# Patient Record
Sex: Female | Born: 1955 | Race: Black or African American | Hispanic: No | Smoking: Former smoker
Health system: Southern US, Community
[De-identification: ages and names within clinical notes are randomized; demographics above are authoritative.]

## PROBLEM LIST (undated history)

## (undated) DIAGNOSIS — H269 Unspecified cataract: Secondary | ICD-10-CM

## (undated) DIAGNOSIS — T7840XA Allergy, unspecified, initial encounter: Secondary | ICD-10-CM

## (undated) HISTORY — PX: CHOLECYSTECTOMY: SHX55

## (undated) HISTORY — PX: CATARACT EXTRACTION, BILATERAL: SHX1313

## (undated) HISTORY — DX: Unspecified cataract: H26.9

## (undated) HISTORY — PX: COLONOSCOPY: SHX174

## (undated) HISTORY — DX: Allergy, unspecified, initial encounter: T78.40XA

---

## 2014-09-24 ENCOUNTER — Other Ambulatory Visit (HOSPITAL_COMMUNITY)
Admission: RE | Admit: 2014-09-24 | Discharge: 2014-09-24 | Disposition: A | Discharge status: Home / Self Care | Payer: 59 | Source: Ambulatory Visit | Attending: Family Medicine | Admitting: Family Medicine

## 2014-09-24 DIAGNOSIS — Z01419 Encounter for gynecological examination (general) (routine) without abnormal findings: Secondary | ICD-10-CM | POA: Diagnosis not present

## 2015-01-12 ENCOUNTER — Other Ambulatory Visit: Payer: Self-pay

## 2015-01-12 DIAGNOSIS — Z1231 Encounter for screening mammogram for malignant neoplasm of breast: Secondary | ICD-10-CM

## 2015-01-17 ENCOUNTER — Emergency Department (HOSPITAL_COMMUNITY)
Admission: EM | Admit: 2015-01-17 | Discharge: 2015-01-17 | Disposition: A | Discharge status: Home / Self Care | Payer: 59 | Attending: Emergency Medicine | Admitting: Emergency Medicine

## 2015-01-17 ENCOUNTER — Emergency Department (HOSPITAL_COMMUNITY): Payer: 59

## 2015-01-17 ENCOUNTER — Encounter (HOSPITAL_COMMUNITY): Payer: Self-pay | Admitting: Cardiology

## 2015-01-17 DIAGNOSIS — S6992XA Unspecified injury of left wrist, hand and finger(s), initial encounter: Secondary | ICD-10-CM | POA: Diagnosis present

## 2015-01-17 DIAGNOSIS — Y998 Other external cause status: Secondary | ICD-10-CM | POA: Insufficient documentation

## 2015-01-17 DIAGNOSIS — S62665A Nondisplaced fracture of distal phalanx of left ring finger, initial encounter for closed fracture: Secondary | ICD-10-CM | POA: Diagnosis not present

## 2015-01-17 DIAGNOSIS — Z87891 Personal history of nicotine dependence: Secondary | ICD-10-CM | POA: Diagnosis not present

## 2015-01-17 DIAGNOSIS — Y9389 Activity, other specified: Secondary | ICD-10-CM | POA: Diagnosis not present

## 2015-01-17 DIAGNOSIS — S62609A Fracture of unspecified phalanx of unspecified finger, initial encounter for closed fracture: Secondary | ICD-10-CM

## 2015-01-17 DIAGNOSIS — W07XXXA Fall from chair, initial encounter: Secondary | ICD-10-CM | POA: Diagnosis not present

## 2015-01-17 DIAGNOSIS — Y9289 Other specified places as the place of occurrence of the external cause: Secondary | ICD-10-CM | POA: Diagnosis not present

## 2015-01-17 MED ORDER — HYDROCODONE-ACETAMINOPHEN 5-325 MG PO TABS: 1.0000 | ORAL_TABLET | Freq: Four times a day (QID) | ORAL | Status: DC | PRN

## 2015-01-17 NOTE — ED Notes (Signed)
Pt reports she fell out of chair Saturday and c/o left ring finger pain.

## 2015-01-17 NOTE — Discharge Instructions (Signed)
Do no take the narcotic while driving. Continue to take the Advil. Follow up with Dr. Ophelia Charter.

## 2015-01-17 NOTE — ED Provider Notes (Signed)
CSN: [id number]     Arrival date & time 01/17/15  1803 History  By signing my name below, I, Murriel Hopper, attest that this documentation has been prepared under the direction and in the presence of Kerrie Buffalo, NP Electronically Signed: Murriel Hopper, ED Scribe. 01/17/2015. 6:58 PM.     Chief Complaint  Patient presents with  . Hand Pain      The history is provided by the patient. No language interpreter was used.   HPI Comments: Tara Nunez is a 59 y.o. female who presents to the Emergency Department complaining of constant left hand pain to her left ring finger with associated swelling that has been present for two days. Pt states she caught her ring finger in a chair when she tried to pull it towards her to sit in it, and fell on the floor. Pt denies hitting her head or any other symptoms. Pt patient has been taking Advil for pain.   History reviewed. No pertinent past medical history. Past Surgical History  Procedure Laterality Date  . Cholecystectomy     History reviewed. No pertinent family history. Social History  Substance Use Topics  . Smoking status: Former Games developer  . Smokeless tobacco: None  . Alcohol Use: No   OB History    No data available     Review of Systems Negative except as stated in HPI  Allergies  Review of patient's allergies indicates no known allergies.  Home Medications   Prior to Admission medications   Medication Sig Start Date End Date Taking? Authorizing Provider  HYDROcodone-acetaminophen (NORCO) 5-325 MG per tablet Take 1 tablet by mouth every 6 (six) hours as needed. 01/17/15   Hope Orlene Och, NP   BP 126/81 mmHg  Pulse 79  Temp(Src) 97.4 F (36.3 C) (Oral)  Resp 20  SpO2 100% Physical Exam  Constitutional: She is oriented to person, place, and time. She appears well-developed and well-nourished.  Non-toxic appearance. No distress.  HENT:  Head: Normocephalic and atraumatic.  Eyes: Conjunctivae, EOM and lids are normal.  Neck:  Normal range of motion. Neck supple. No thyroid mass present.  Cardiovascular: Normal rate.  Exam reveals no gallop.   No murmur heard. Pulmonary/Chest: Effort normal. She has no decreased breath sounds. She has no rhonchi.  Abdominal: Normal appearance. There is no CVA tenderness.  Musculoskeletal:       Left hand: She exhibits tenderness, bony tenderness and swelling. She exhibits normal capillary refill, no deformity and no laceration. Decreased range of motion: due to pain. Normal sensation noted. Normal strength noted.       Hands: 2+ radial pulse Adequate circulation Pain, swelling, ecchymosis of left ring finger at PIP  Neurological: She is alert and oriented to person, place, and time. She has normal strength. No cranial nerve deficit or sensory deficit. GCS eye subscore is 4. GCS verbal subscore is 5. GCS motor subscore is 6.  Skin: Skin is warm and dry. No abrasion noted.  Skin intact  Psychiatric: She has a normal mood and affect. Her speech is normal and behavior is normal.  Nursing note and vitals reviewed.   ED Course  Procedures (including critical care time) DIAGNOSTIC STUDIES: Oxygen Saturation is 100% on room air, normal by my interpretation.    COORDINATION OF CARE: 6:58 PM Discussed treatment plan with pt at bedside and pt agreed to plan.   Labs Review Labs Reviewed - No data to display  Imaging Review Dg Hand Complete Left  01/17/2015  CLINICAL DATA:  Pain in the DIP joint of the ring finger after a fall with hyperextension injury.  EXAM: LEFT HAND - COMPLETE 3+ VIEW  COMPARISON:  None.  FINDINGS: There is a nondisplaced fracture through the base of the distal phalangeal bone of the ring finger best seen on the lateral view. The fracture does not extend to the articular surface.  Slight arthritic changes of the DIP joint of the long finger and of the PIP joint of the ring finger. Congenital fusion of the lunate and triquetrum.  IMPRESSION: Nondisplaced fracture  of the distal phalanx of the ring finger.   Electronically Signed   By: Francene Boyers M.D.   On: 01/17/2015 19:38   I have personally reviewed and evaluated these images as part of my medical decision-making.   MDM  58 y.o. female with left ring finger pain, swelling and ecchymosis s/p injury 2 days ago. Stable for d/c without focal neuro deficits. Splint applied, ice, pain management and follow up with ortho.  Discussed with the patient clinical and x-ray findings and plan of care. All questioned fully answered. .  Final diagnoses:  Fracture, finger, closed, initial encounter   I personally performed the services described in this documentation, which was scribed in my presence. The recorded information has been reviewed and is accurate.   [place], [place] 01/17/15 2117  Benjiman Core, MD 01/18/15 [phone]

## 2015-01-17 NOTE — ED Notes (Signed)
Patient left at this time with all belongings. 

## 2015-02-15 ENCOUNTER — Ambulatory Visit
Admission: RE | Admit: 2015-02-15 | Discharge: 2015-02-15 | Disposition: A | Discharge status: Home / Self Care | Payer: 59 | Source: Ambulatory Visit

## 2015-02-15 DIAGNOSIS — Z1231 Encounter for screening mammogram for malignant neoplasm of breast: Secondary | ICD-10-CM

## 2017-09-26 IMAGING — DX DG HAND COMPLETE 3+V*L*
3 series · 3 of 3 positions shown · non-contrast
Comparison: None.

CLINICAL DATA: Pain in the DIP joint of the ring finger after a
fall with hyperextension injury.

EXAM:
LEFT HAND - COMPLETE 3+ VIEW

[hand pa]
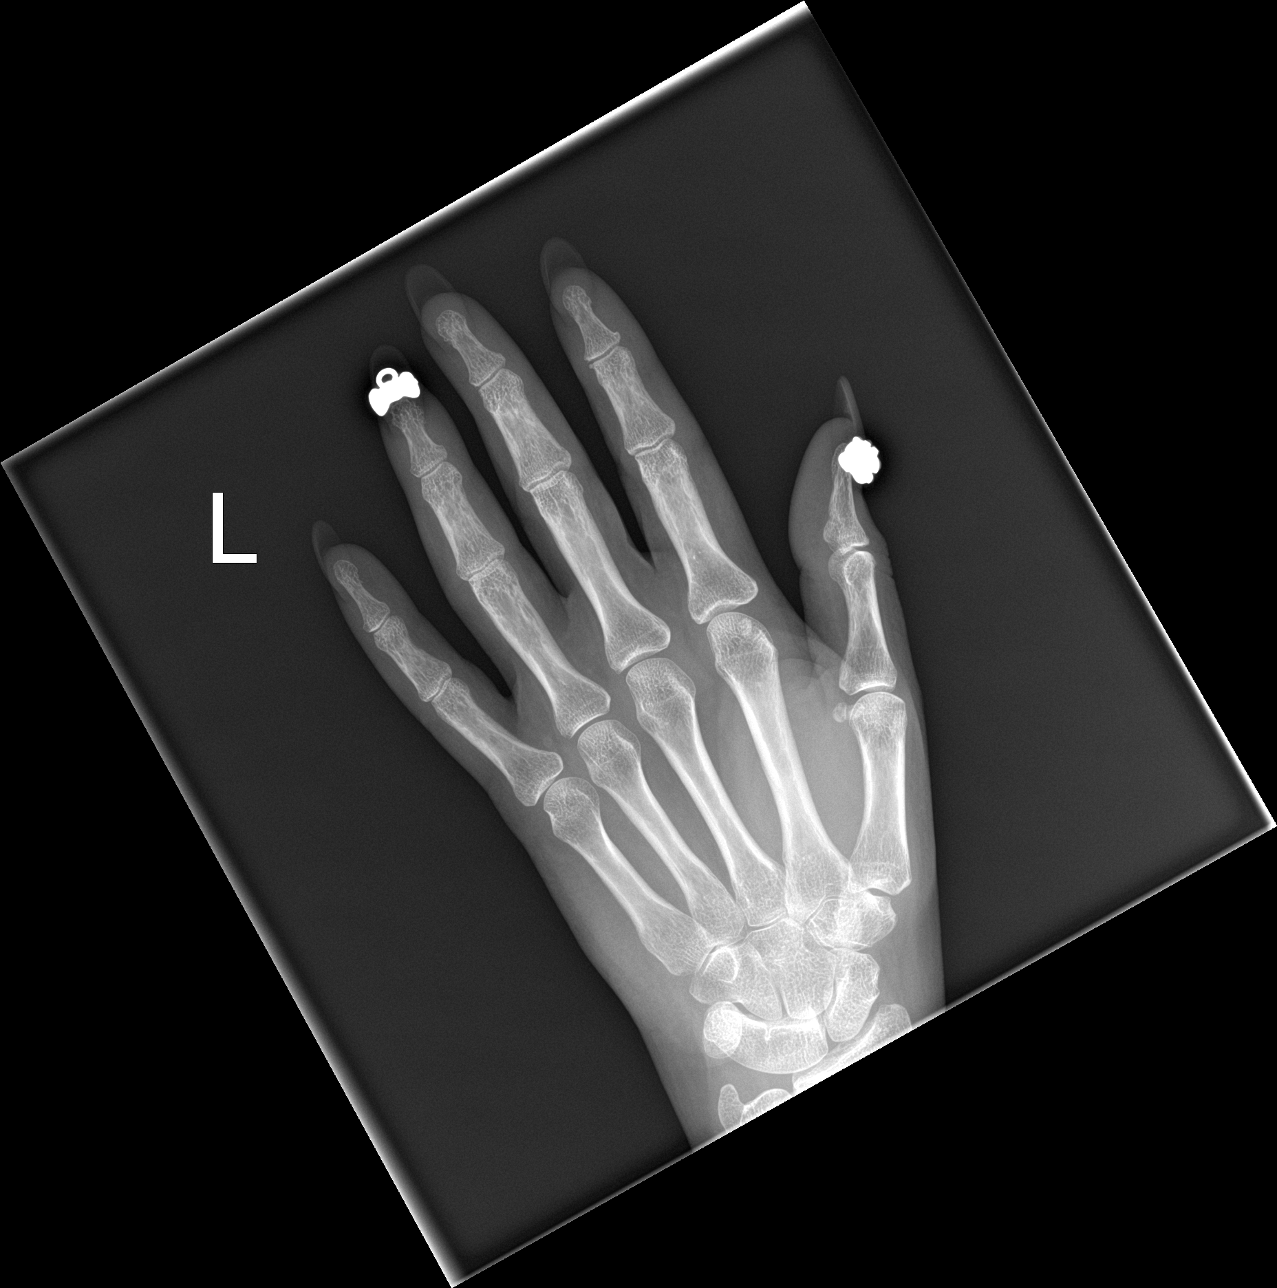

[hand obl]
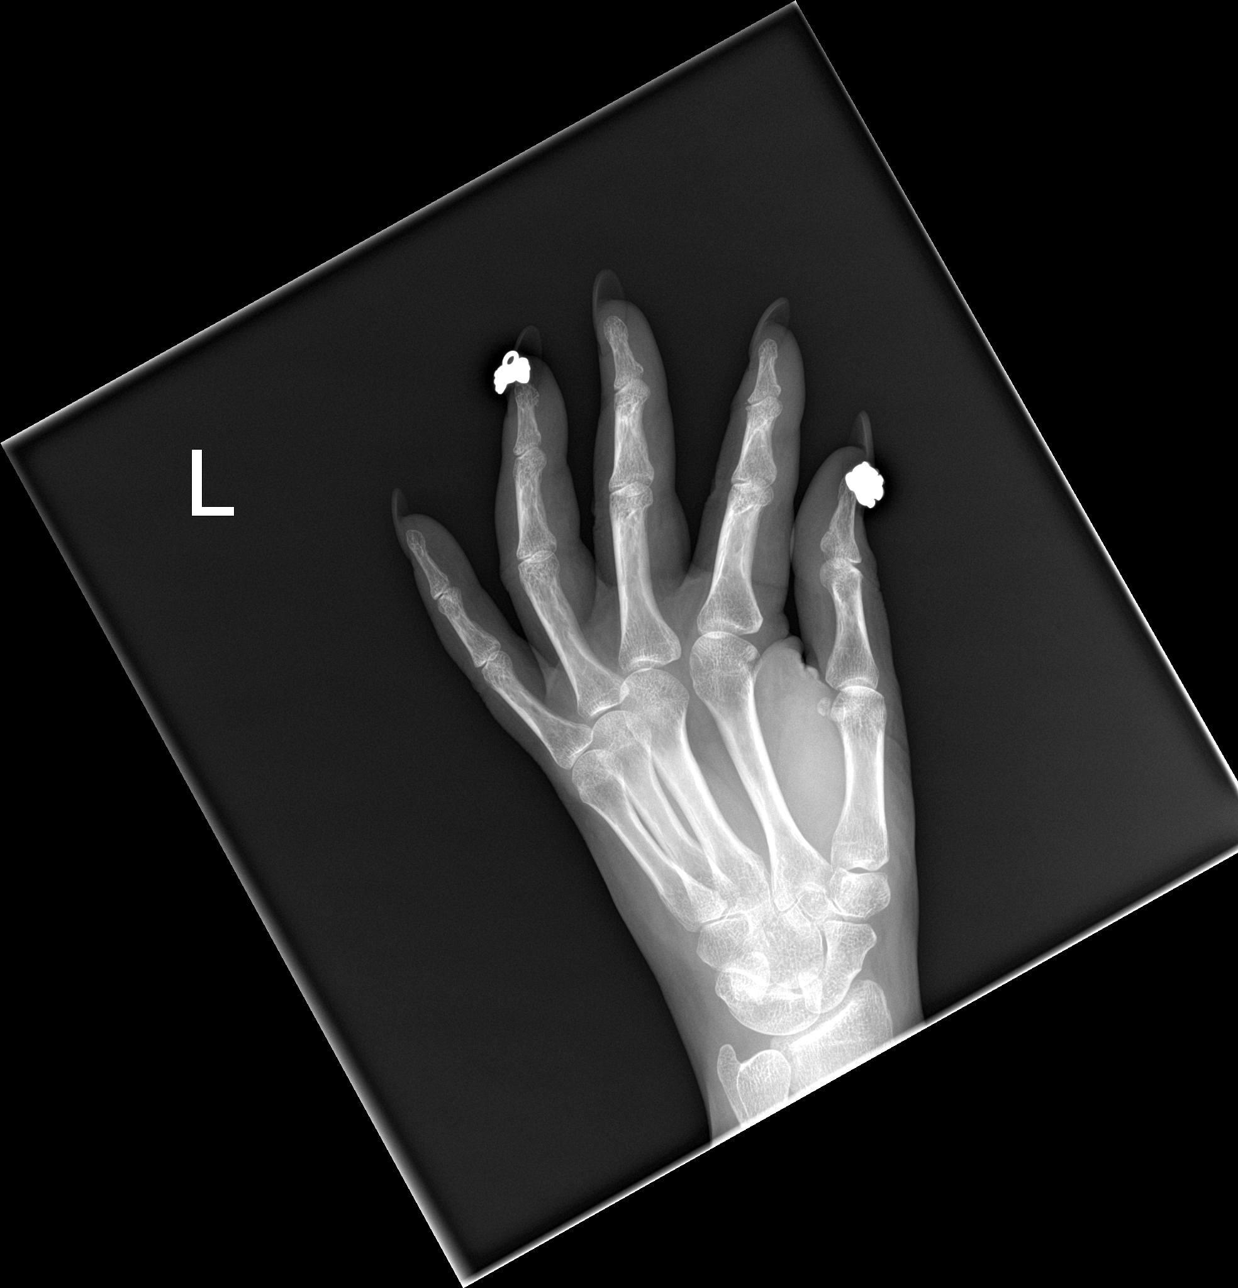

[hand lat]
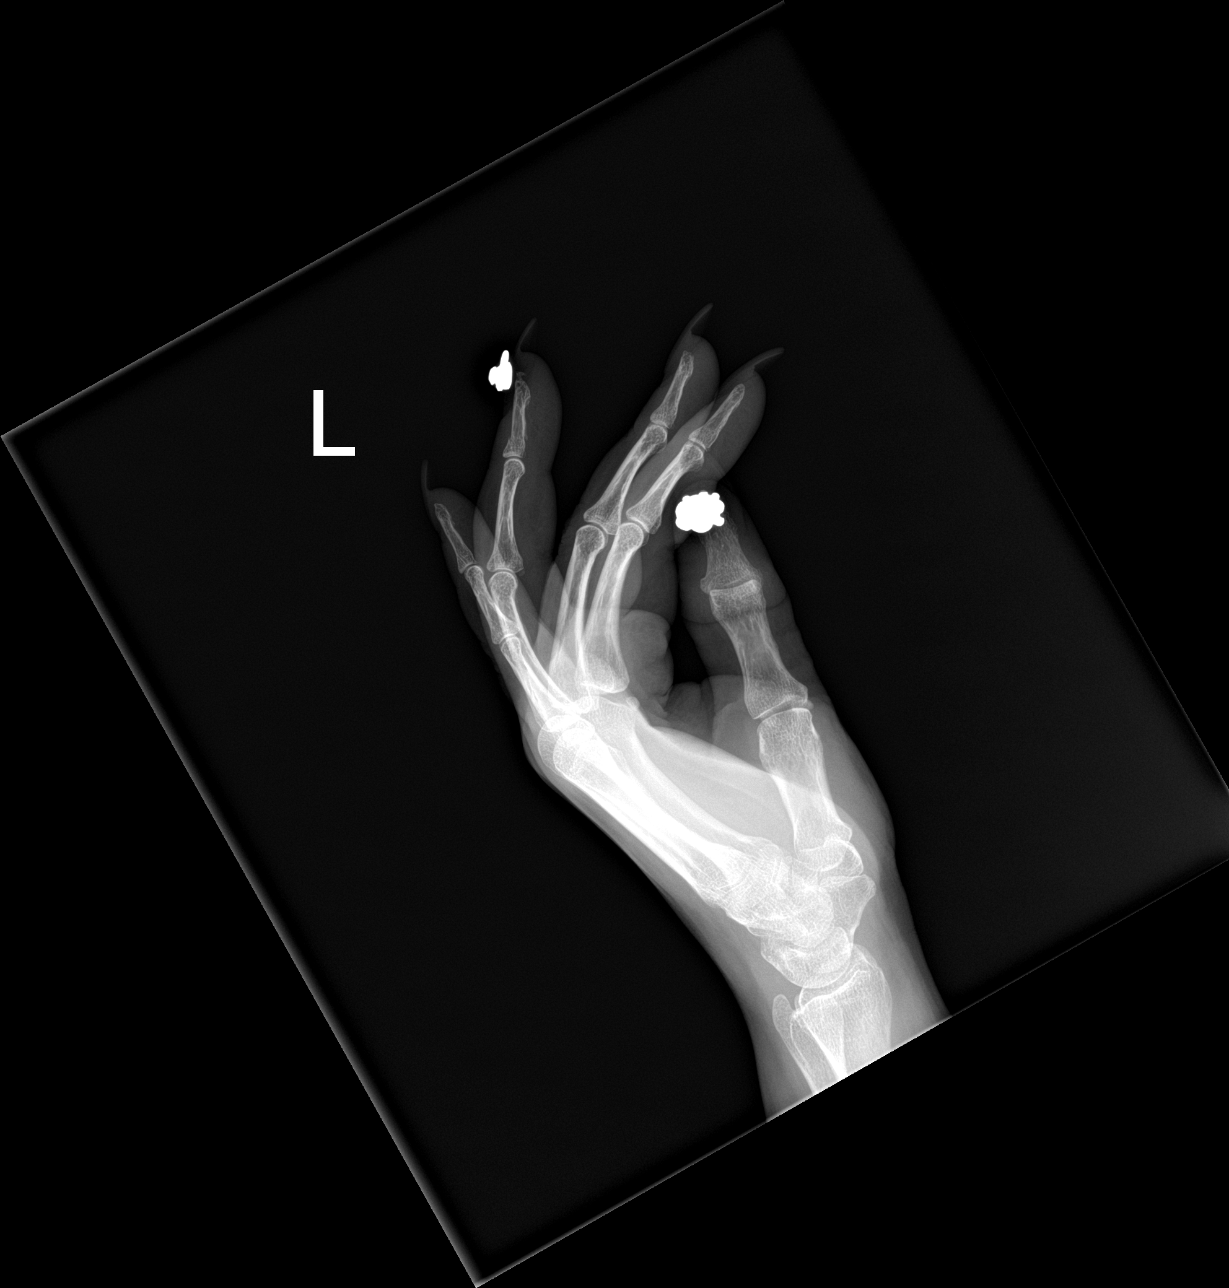

[3 of 3 positions shown; findings below may reference images not displayed]

FINDINGS: There is a nondisplaced fracture through the base of the distal
phalangeal bone of the ring finger best seen on the lateral view.
The fracture does not extend to the articular surface.

Slight arthritic changes of the DIP joint of the long finger and of
the PIP joint of the ring finger. Congenital fusion of the lunate
and triquetrum.
IMPRESSION: Nondisplaced fracture of the distal phalanx of the ring finger.

## 2019-03-10 ENCOUNTER — Other Ambulatory Visit: Payer: Self-pay

## 2019-03-10 DIAGNOSIS — Z20822 Contact with and (suspected) exposure to covid-19: Secondary | ICD-10-CM

## 2019-03-12 LAB — NOVEL CORONAVIRUS, NAA: SARS-CoV-2, NAA: NOT DETECTED

## 2019-06-12 ENCOUNTER — Encounter: Payer: Self-pay | Admitting: Gastroenterology

## 2019-07-16 ENCOUNTER — Other Ambulatory Visit: Payer: Self-pay

## 2019-07-16 ENCOUNTER — Ambulatory Visit (AMBULATORY_SURGERY_CENTER): Payer: Self-pay | Admitting: *Deleted

## 2019-07-16 VITALS — Temp 96.7°F | Ht 63.75 in | Wt 177.0 lb

## 2019-07-16 DIAGNOSIS — Z1211 Encounter for screening for malignant neoplasm of colon: Secondary | ICD-10-CM

## 2019-07-16 DIAGNOSIS — Z01818 Encounter for other preprocedural examination: Secondary | ICD-10-CM

## 2019-07-16 MED ORDER — SUPREP BOWEL PREP KIT 17.5-3.13-1.6 GM/177ML PO SOLN: ORAL | 0 refills | Status: DC

## 2019-07-16 NOTE — Progress Notes (Signed)
covid test 07-27-19 at 910  Pt is aware that care partner will wait in the car during procedure; if they feel like they will be too hot or cold to wait in the car; they may wait in the 4 th floor lobby. Patient is aware to bring only one care partner. We want them to wear a mask (we do not have any that we can provide them), practice social distancing, and we will check their temperatures when they get here.  I did remind the patient that their care partner needs to stay in the parking lot the entire time and have a cell phone available, we will call them when the pt is ready for discharge. Patient will wear mask into building.   No egg or soy allergy  No home oxygen use   No medications for weight loss taken  emmi information given   No trouble with anesthesia, difficulty with intubation or hx/fam hx of malignant hyperthermia per pt

## 2019-07-29 ENCOUNTER — Other Ambulatory Visit: Payer: Self-pay | Admitting: Gastroenterology

## 2019-07-29 LAB — SARS CORONAVIRUS 2 (TAT 6-24 HRS): SARS Coronavirus 2: NEGATIVE

## 2019-07-30 ENCOUNTER — Ambulatory Visit (AMBULATORY_SURGERY_CENTER): Payer: 59 | Admitting: Gastroenterology

## 2019-07-30 ENCOUNTER — Encounter: Payer: Self-pay | Admitting: Gastroenterology

## 2019-07-30 ENCOUNTER — Other Ambulatory Visit: Payer: Self-pay

## 2019-07-30 VITALS — BP 122/77 | HR 73 | Temp 97.1°F | Resp 20 | Ht 62.75 in | Wt 170.0 lb

## 2019-07-30 DIAGNOSIS — Z1211 Encounter for screening for malignant neoplasm of colon: Secondary | ICD-10-CM | POA: Diagnosis not present

## 2019-07-30 MED ORDER — SODIUM CHLORIDE 0.9 % IV SOLN: 500.0000 mL | Freq: Once | INTRAVENOUS | Status: DC

## 2019-07-30 NOTE — Progress Notes (Signed)
Report to PACU, RN, vss, BBS= Clear.  

## 2019-07-30 NOTE — Progress Notes (Signed)
Pt's states no medical or surgical changes since previsit or office visit.  JB - temp CW - vitals. 

## 2019-07-30 NOTE — Patient Instructions (Signed)
Repeat colonoscopy in 10 years.  ° °YOU HAD AN ENDOSCOPIC PROCEDURE TODAY AT THE [facility]:   Refer to the procedure report that was given to you for any specific questions about what was found during the examination.  If the procedure report does not answer your questions, please call your gastroenterologist to clarify.  If you requested that your care partner not be given the details of your procedure findings, then the procedure report has been included in a sealed envelope for you to review at your convenience later. ° °YOU SHOULD EXPECT: Some feelings of bloating in the abdomen. Passage of more gas than usual.  Walking can help get rid of the air that was put into your GI tract during the procedure and reduce the bloating. If you had a lower endoscopy (such as a colonoscopy or flexible sigmoidoscopy) you may notice spotting of blood in your stool or on the toilet paper. If you underwent a bowel prep for your procedure, you may not have a normal bowel movement for a few days. ° °Please Note:  You might notice some irritation and congestion in your nose or some drainage.  This is from the oxygen used during your procedure.  There is no need for concern and it should clear up in a day or so. ° °SYMPTOMS TO REPORT IMMEDIATELY: ° °Following lower endoscopy (colonoscopy or flexible sigmoidoscopy): ° Excessive amounts of blood in the stool ° Significant tenderness or worsening of abdominal pains ° Swelling of the abdomen that is new, acute ° Fever of 100°F or higher ° °For urgent or emergent issues, a gastroenterologist can be reached at any hour by calling [phone]. °Do not use MyChart messaging for urgent concerns.  ° ° °DIET:  We do recommend a small meal at first, but then you may proceed to your regular diet.  Drink plenty of fluids but you should avoid alcoholic beverages for 24 hours. ° °ACTIVITY:  You should plan to take it easy for the rest of today and you should NOT DRIVE or use heavy  machinery until tomorrow (because of the sedation medicines used during the test).   ° °FOLLOW UP: °Our staff will call the number listed on your records 48-72 hours following your procedure to check on you and address any questions or concerns that you may have regarding the information given to you following your procedure. If we do not reach you, we will leave a message.  We will attempt to reach you two times.  During this call, we will ask if you have developed any symptoms of COVID 19. If you develop any symptoms (ie: fever, flu-like symptoms, shortness of breath, cough etc.) before then, please call [phone].  If you test positive for Covid 19 in the 2 weeks post procedure, please call and report this information to us.   ° °If any biopsies were taken you will be contacted by phone or by letter within the next 1-3 weeks.  Please call us at [phone] if you have not heard about the biopsies in 3 weeks.  ° ° °SIGNATURES/CONFIDENTIALITY: °You and/or your care partner have signed paperwork which will be entered into your electronic medical record.  These signatures attest to the fact that that the information above on your After Visit Summary has been reviewed and is understood.  Full responsibility of the confidentiality of this discharge information lies with you and/or your care-partner. ° °

## 2019-07-30 NOTE — Op Note (Signed)
[facility] Patient Name: Tara Nunez Procedure Date: 07/30/2019 11:57 AM MRN: [id number] Endoscopist: [place]. Loletha Carrow , MD Age: 64 Referring MD:  Date of Birth: March 12, 1956 Gender: Female Account #: [id number] Procedure:                Colonoscopy Indications:              Screening for colorectal malignant neoplasm                            (reportedly normal colonoscopy 10 years ago) Medicines:                Monitored Anesthesia Care Procedure:                Pre-Anesthesia Assessment:                           - Prior to the procedure, a History and Physical                            was performed, and patient medications and                            allergies were reviewed. The patient's tolerance of                            previous anesthesia was also reviewed. The risks                            and benefits of the procedure and the sedation                            options and risks were discussed with the patient.                            All questions were answered, and informed consent                            was obtained. Prior Anticoagulants: The patient has                            taken no previous anticoagulant or antiplatelet                            agents. ASA Grade Assessment: II - A patient with                            mild systemic disease. After reviewing the risks                            and benefits, the patient was deemed in                            satisfactory condition to undergo the procedure.  After obtaining informed consent, the colonoscope                            was passed under direct vision. Throughout the                            procedure, the patient's blood pressure, pulse, and                            oxygen saturations were monitored continuously. The                            Colonoscope was introduced through the anus and                            advanced to the the  cecum, identified by                            appendiceal orifice and ileocecal valve. The                            colonoscopy was performed without difficulty. The                            patient tolerated the procedure well. The quality                            of the bowel preparation was excellent. The                            ileocecal valve, appendiceal orifice, and rectum                            were photographed. Scope In: 12:04:20 PM Scope Out: 12:13:58 PM Scope Withdrawal Time: 0 hours 7 minutes 21 seconds  Total Procedure Duration: 0 hours 9 minutes 38 seconds  Findings:                 The perianal and digital rectal examinations were                            normal.                           The entire examined colon appeared normal on direct                            and retroflexion views. Complications:            No immediate complications. Estimated Blood Loss:     Estimated blood loss: none. Impression:               - The entire examined colon is normal on direct and                            retroflexion views.                           -  No specimens collected. Recommendation:           - Patient has a contact number available for                            emergencies. The signs and symptoms of potential                            delayed complications were discussed with the                            patient. Return to normal activities tomorrow.                            Written discharge instructions were provided to the                            patient.                           - Resume previous diet.                           - Continue present medications.                           - Repeat colonoscopy in 10 years for screening                            purposes. Kennedey Digilio L. Myrtie Neither, MD 07/30/2019 12:17:11 PM This report has been signed electronically.

## 2019-08-03 ENCOUNTER — Telehealth: Payer: Self-pay | Admitting: *Deleted

## 2019-08-03 NOTE — Telephone Encounter (Signed)
No answer for post procedure call back. Unable to leave message. 

## 2019-08-03 NOTE — Telephone Encounter (Signed)
First follow up call attempt.  Mobile had different name , attempted home phone, no answer.

## 2020-09-22 ENCOUNTER — Ambulatory Visit (INDEPENDENT_AMBULATORY_CARE_PROVIDER_SITE_OTHER): Payer: 59 | Admitting: Internal Medicine

## 2020-09-22 ENCOUNTER — Encounter: Payer: Self-pay | Admitting: Internal Medicine

## 2020-09-22 DIAGNOSIS — Z780 Asymptomatic menopausal state: Secondary | ICD-10-CM

## 2020-09-22 DIAGNOSIS — M8580 Other specified disorders of bone density and structure, unspecified site: Secondary | ICD-10-CM | POA: Diagnosis not present

## 2020-09-22 DIAGNOSIS — H9193 Unspecified hearing loss, bilateral: Secondary | ICD-10-CM

## 2020-09-22 DIAGNOSIS — E669 Obesity, unspecified: Secondary | ICD-10-CM

## 2020-09-22 DIAGNOSIS — G4700 Insomnia, unspecified: Secondary | ICD-10-CM | POA: Insufficient documentation

## 2020-09-22 DIAGNOSIS — E66811 Obesity, class 1: Secondary | ICD-10-CM

## 2020-09-22 DIAGNOSIS — E663 Overweight: Secondary | ICD-10-CM | POA: Insufficient documentation

## 2020-09-22 DIAGNOSIS — Z860100 Personal history of colon polyps, unspecified: Secondary | ICD-10-CM

## 2020-09-22 DIAGNOSIS — H9190 Unspecified hearing loss, unspecified ear: Secondary | ICD-10-CM | POA: Insufficient documentation

## 2020-09-22 NOTE — Progress Notes (Signed)
65 yo Tara Nunez is here to establish primary care.  She has no significant chronic medical conditions, and her health maintenance has been managed through her cousin who is a Development worker, community at [facility].  Her only prescribed medication is zolpidem 10 mg which she takes as needed.  She reports that she has a cell phone/Internet habit which she identifies as an obvious problem preventing initiation of sleep.  She is working on this.  She has no concerning symptoms to report.  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  She will be spending the summer in [place] where she will be caring for grandchildren there, and enjoys going back and forth remaining active in her family's lives.  Recently has been fitted with hearing aids, and is undergoing delayed dental care, having recently had an extraction from which she is healing. Has been referred to dermatologist for brown macule on L cheek.    Health care goals - concerned about her back, wants to work on strength and mobility, wants to work on insomnia (uses phone/bluelight exposure) - sleeps about 4 hrs a night, feels tired but does not nap during the day and is able to stay awake.  Concerned about COVID and minimizes her exposure to groups.  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  Currently walking up and down hill in her residential area.  [place]tting ready to go to [facility] for summer, returns in September. WIll be up and down stairs frequently which she identifies as a good source of exercise.  She would like to lose weight and is mindful of her diet and caloric intake.  Medical history: No chronic health conditions.  Medications: None prescribed.  Takes as needed ibuprofen, and takes a vitamin D capsule supplement OTC of unknown strength.  Surgical history:  Family history: Mother deceased, heart failure.    Prevention and health  maintenance: Annual mammography, most recently last month through [facility], as ordered by her cousin.  DEXA done there as well which showed osteopenia, now taking vitamin D and eating cheese and other milk products, takes vitamin D capsules, as has been blood tests.  Colonoscopies (low [facility]) have been routine since age 69 due to hematochezia evaluation (polyps). Most recent one was 07/2019, Dr. Myrtie Neither.  Has never accepted flu vaccine "I do not ever get the flu", but has had Covid vaccinations.    BP 115/69 (BP Location: Right Arm, Patient Position: Sitting, Cuff Size: Small)   Pulse 92   Temp 98.2 F (36.8 C) (Oral)   Ht 5\' 2"  (1.575 m)   Wt 165 lb 4.8 oz (75 kg)   SpO2 99%   BMI 30.23 kg/m   Ms. Buttler is bright and interactive in conversation.  Looks younger than stated age.  Habitus overweight.  She moves smoothly and quickly, arises from a chair without using her arms and easily was able to sit on the end of the exam table.  Normal smooth gait.  Full range of motion without pain at the neck shoulders hips and knees.  No palpable thyroid abnormality.  No carotid bruits or JVD.  Heart regular rate and rhythm without murmur.  Lungs are clear to auscultation to the bases.  Strength is full and symmetric at the upper and lower extremities tested across all joints.  Pulses are 2+ at the radials and 1+ at dorsalis pedis.  Skin turgor is normal, quality is smooth and well cared for.  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  Overlying cheek without edema or tenderness.  Hyperpigmented nonscaly macule noted over left maxillary area.  Assessment and plan: 65 year old healthy woman establishing care.  She wishes to work on maintenance of mobility and strength and would like to lose some weight.  She has a free gym where she lives although does not use it.  Most of her exercise comes from intentional walking.  Maintenance and preventative measures are UTD.  We will have her sign a release  of information form to receive results from blood tests ordered by her cousin at the OB/GYN practice.  Osteopenia will be added to her problem list and she will continue vitamin D supplementation and enhancement of vitamin D-containing foods as well as safe sun exposure.  1 year follow-up unless earlier visits needed for acute problems. TDAP, zoster vaccine, HIV and Hep C screening, and lipid screening will be discussed at a future appointment.

## 2020-09-22 NOTE — Patient Instructions (Signed)
Wonderful to meet you today, Tara Nunez!  I'm thrilled that you are doing so well.  It was a pleasure to visit with you and review your medical history and your concerns.  Thank you for trusting [place] with your care.  I will see you in a year, but sooner of course if needed.  Please schedule an earlier appointment anytime you wish.  Take care, stay well, and enjoy your [place] trip!  Dr. Mayford Knife

## 2020-10-25 ENCOUNTER — Encounter: Payer: Self-pay | Admitting: *Deleted

## 2021-05-02 DIAGNOSIS — L239 Allergic contact dermatitis, unspecified cause: Secondary | ICD-10-CM | POA: Diagnosis not present

## 2021-05-04 DIAGNOSIS — L239 Allergic contact dermatitis, unspecified cause: Secondary | ICD-10-CM | POA: Diagnosis not present

## 2021-05-09 DIAGNOSIS — L239 Allergic contact dermatitis, unspecified cause: Secondary | ICD-10-CM | POA: Diagnosis not present

## 2021-05-14 DIAGNOSIS — J309 Allergic rhinitis, unspecified: Secondary | ICD-10-CM | POA: Diagnosis not present

## 2021-05-14 DIAGNOSIS — L209 Atopic dermatitis, unspecified: Secondary | ICD-10-CM | POA: Diagnosis not present

## 2021-08-22 ENCOUNTER — Encounter: Payer: Self-pay | Admitting: Internal Medicine

## 2021-08-23 ENCOUNTER — Encounter: Payer: Self-pay | Admitting: Internal Medicine

## 2021-09-05 DIAGNOSIS — M65312 Trigger thumb, left thumb: Secondary | ICD-10-CM | POA: Diagnosis not present

## 2021-11-13 ENCOUNTER — Ambulatory Visit: Payer: 59 | Admitting: Student

## 2021-11-13 DIAGNOSIS — L249 Irritant contact dermatitis, unspecified cause: Secondary | ICD-10-CM | POA: Insufficient documentation

## 2021-11-13 HISTORY — DX: Irritant contact dermatitis, unspecified cause: L24.9

## 2021-11-13 MED ORDER — PREDNISONE 50 MG PO TABS: ORAL_TABLET | ORAL | 0 refills | Status: DC

## 2021-11-13 MED ORDER — TRIAMCINOLONE ACETONIDE 0.025 % EX OINT: 1.0000 | TOPICAL_OINTMENT | Freq: Two times a day (BID) | CUTANEOUS | 0 refills | Status: AC

## 2021-11-13 NOTE — Progress Notes (Signed)
CC: Facial Rash  HPI:  [name] is a 66 y.o. female living with a history stated below and presents today for rash on the bottom right side of her lip. Please see problem based assessment and plan for additional details.  Past Medical History:  Diagnosis Date   Allergy    Cataract     Current Outpatient Medications on File Prior to Visit  Medication Sig Dispense Refill   Acetaminophen (TYLENOL PO) Take by mouth as needed.     Naproxen Sodium (ALEVE PO) Take by mouth as needed.     prednisoLONE acetate (PRED FORTE) 1 % ophthalmic suspension 4 times daily each eye     zolpidem (AMBIEN) 10 MG tablet Take 10 mg by mouth at bedtime as needed.     No current facility-administered medications on file prior to visit.    Family History  Problem Relation Age of Onset   Stomach cancer Other    Colon cancer Neg Hx    Esophageal cancer Neg Hx    Rectal cancer Neg Hx     Social History   Socioeconomic History   Marital status: Unknown    Spouse name: Not on file   Number of children: Not on file   Years of education: Not on file   Highest education level: Not on file  Occupational History   Not on file  Tobacco Use   Smoking status: Former   Smokeless tobacco: Never  Vaping Use   Vaping Use: Never used  Substance and Sexual Activity   Alcohol use: No   Drug use: No   Sexual activity: Not on file  Other Topics Concern   Not on file  Social History Narrative   Not on file   Social Determinants of Health   Financial Resource Strain: Not on file  Food Insecurity: Not on file  Transportation Needs: Not on file  Physical Activity: Not on file  Stress: Not on file  Social Connections: Not on file  Intimate Partner Violence: Not on file    Review of Systems: ROS negative except for what is noted on the assessment and plan.  Vitals:   11/13/21 1343  BP: 129/70  Pulse: 86  Temp: 98 F (36.7 C)  TempSrc: Oral  SpO2: (!) 86%  Weight: 169 lb 9.6 oz (76.9  kg)  Height: 5\' 2"  (1.575 m)    Physical Exam: Constitutional: well-appearing woman, resisting urge to itch, in no acute distress HENT: normocephalic atraumatic, mucous membranes moist. Erythema and peeling on lower right side of lip, airway open Eyes: conjunctiva non-erythematous, swelling of eyes b/l Cardiovascular: regular rate and rhythm, no m/r/g Pulmonary/Chest: normal work of breathing on room air, lungs clear to auscultation bilaterally MSK: normal bulk and tone Skin: warm and dry   Assessment & Plan:   Irritant contact dermatitis Pt has been complaining of a small rash on the bottom right side of her lower lip. This rash started on Friday as she was driving to Sunday. She noticed it was itching first, then placed makeup on it which made the rash and pruritis worse. She has a very extensive allergy history, as she is allergic to pollen, wood, dogs, dust, soap. She saw an allergist in December, and was scheduled to receive an injection of Kenalog in April, however she missed the appointment due to personal reasons. She currently takes May daily. She has tried to put Vaseline on the rash but she claims it made it worse.   On PE  there is erythema on the lower right side of the bottom lip, and she's resisting the urge to itch it. Her lips are also very dry and cracked.   She denies any fever, nausea, vomiting, SOB.   Plan:   - I prescribed a 5 day course of oral prednisone to help control  - I also prescribed a Kenalog ointment to use on the affected area, and educated her on how to use it.  - Recommended to make another appointment with her allergist  - Reminded her to not use any makeup or put anything on her face until it clears.  - Take benadryl as needed and to help her sleep.   Patient seen with Dr. Dierdre Forth Dalis Beers, M.D. [facility], PGY-1 Phone: [phone] Date 11/13/2021 Time 6:04 PM

## 2021-11-13 NOTE — Patient Instructions (Signed)
Thank you so much for coming to the clinic today!  We talked about a few things today here is a quick summary:  1.  It looks like what is going on is just another allergic reaction, please continue to take the Allegra.  We are also giving you some prednisone as well, please take 1 daily for 5 days.  We're giving you some cream to apply to the area! Make sure to not apply to the eyes or get it inside of your mouth.  Also please remember to not use any make-up for the time being. I hope you have a great time in [place]!  If you have any questions please call the clinic at [phone]  Dr. Jason Fila Laurent [place]

## 2021-11-13 NOTE — Assessment & Plan Note (Signed)
Pt has been complaining of a small rash on the bottom right side of her lower lip. This rash started on Friday as she was driving to [place]. She noticed it was itching first, then placed makeup on it which made the rash and pruritis worse. She has a very extensive allergy history, as she is allergic to pollen, wood, dogs, dust, soap. She saw an allergist in December, and was scheduled to receive an injection of Kenalog in April, however she missed the appointment due to personal reasons. She currently takes Careers adviser daily. She has tried to put Vaseline on the rash but she claims it made it worse.   On PE there is erythema on the lower right side of the bottom lip, and she's resisting the urge to itch it. Her lips are also very dry and cracked.   She denies any fever, nausea, vomiting, SOB.   Plan:   - I prescribed a 5 day course of oral prednisone to help control  - I also prescribed a Kenalog ointment to use on the affected area, and educated her on how to use it.  - Recommended to make another appointment with her allergist  - Reminded her to not use any makeup or put anything on her face until it clears.  - Take benadryl as needed and to help her sleep.

## 2021-11-14 NOTE — Progress Notes (Signed)
Internal Medicine Clinic Attending  I saw and evaluated the patient.  I personally confirmed the key portions of the history and exam documented by Dr. Thomasene Ripple and I reviewed pertinent patient test results.  The assessment, diagnosis, and plan were formulated together and I agree with the documentation in the resident's note.    Note: Pulse ox 86% was entered in error (this was the HR). Patient did not have any cough, dyspnea, and her lungs were clear.   Agree with short course of oral prednisone + triamcinolone cream for her chin / under her lip. I counseled her to make sure the steroid cream does not contact her eyes or get ingested, and she understands.

## 2022-02-14 DIAGNOSIS — R059 Cough, unspecified: Secondary | ICD-10-CM | POA: Diagnosis not present

## 2022-02-15 DIAGNOSIS — M65312 Trigger thumb, left thumb: Secondary | ICD-10-CM | POA: Diagnosis not present

## 2022-02-22 ENCOUNTER — Ambulatory Visit: Payer: 59 | Admitting: Student

## 2022-02-22 ENCOUNTER — Encounter: Payer: Self-pay | Admitting: Student

## 2022-02-22 VITALS — BP 118/93 | HR 85 | Ht 62.0 in | Wt 167.5 lb

## 2022-02-22 DIAGNOSIS — Z23 Encounter for immunization: Secondary | ICD-10-CM

## 2022-02-22 MED ORDER — TETANUS-DIPHTHERIA TOXOIDS TD 5-2 LFU IM INJ: 0.5000 mL | INJECTION | Freq: Once | INTRAMUSCULAR | Status: DC

## 2022-02-22 NOTE — Patient Instructions (Signed)
Tara Nunez,  It was a pleasure seeing you in the clinic today.   You got your tetanus booster today. Please make sure to get your Shingles vaccine at any local pharmacy. Your next appointment is with your primary care doctor, Dr. Saverio Danker, on 05/14/2022.  Please call our clinic at [phone] if you have any questions or concerns. The best time to call is Monday-Friday from 9am-4pm, but there is someone available 24/7 at the same number. If you need medication refills, please notify your pharmacy one week in advance and they will send [place] a request.   Thank you for letting us take part in your care. We look forward to seeing you next time!

## 2022-02-22 NOTE — Progress Notes (Signed)
   CC: encounter for tetanus vaccine  HPI:  [name] is a 66 y.o. female with history listed below presenting to the [facility] for encounter for tetanus vaccine. Please see individualized problem based charting for full HPI. Patient initially presented for a skin tag that she noticed on her left mid-back. However, per patient, skin tag "fell off" yesterday. No acute concerns for today's visit.  Past Medical History:  Diagnosis Date   Allergy    Cataract     Review of Systems:  Negative aside from that listed in individualized problem based charting.  Physical Exam:  Vitals:   02/22/22 1330  Weight: 167 lb 8 oz (76 kg)  Height: 5\' 2"  (1.575 m)   Physical Exam Constitutional:      Appearance: Normal appearance. She is obese. She is not ill-appearing.  HENT:     Mouth/Throat:     Mouth: Mucous membranes are moist.     Pharynx: Oropharynx is clear.  Eyes:     Extraocular Movements: Extraocular movements intact.     Conjunctiva/sclera: Conjunctivae normal.     Pupils: Pupils are equal, round, and reactive to light.  Cardiovascular:     Rate and Rhythm: Normal rate and regular rhythm.     Pulses: Normal pulses.     Heart sounds: Normal heart sounds. No murmur heard.    No friction rub. No gallop.  Pulmonary:     Effort: Pulmonary effort is normal.     Breath sounds: Normal breath sounds. No wheezing, rhonchi or rales.  Abdominal:     General: Bowel sounds are normal. There is no distension.     Palpations: Abdomen is soft.     Tenderness: There is no abdominal tenderness.  Musculoskeletal:        General: Normal range of motion.  Skin:    General: Skin is warm and dry.     Comments: No skin tags currently present on prior location (left mid-back).  Neurological:     General: No focal deficit present.     Mental Status: She is alert and oriented to person, place, and time.  Psychiatric:        Mood and Affect: Mood normal.        Behavior: Behavior normal.       Assessment & Plan:   See Encounters Tab for problem based charting.  Patient discussed with Dr.  Saverio Danker

## 2022-02-28 NOTE — Progress Notes (Signed)
Internal Medicine Clinic Attending  Case discussed with Dr. Jinwala  At the time of the visit.  We reviewed the resident's history and exam and pertinent patient test results.  I agree with the assessment, diagnosis, and plan of care documented in the resident's note.  

## 2022-03-02 DIAGNOSIS — M65312 Trigger thumb, left thumb: Secondary | ICD-10-CM | POA: Diagnosis not present

## 2022-05-14 ENCOUNTER — Encounter: Payer: 59 | Admitting: Internal Medicine

## 2022-06-11 ENCOUNTER — Encounter: Payer: 59 | Admitting: Internal Medicine

## 2022-08-13 ENCOUNTER — Encounter: Payer: 59 | Admitting: Internal Medicine

## 2022-11-02 DIAGNOSIS — L301 Dyshidrosis [pompholyx]: Secondary | ICD-10-CM | POA: Diagnosis not present

## 2022-11-02 DIAGNOSIS — L2089 Other atopic dermatitis: Secondary | ICD-10-CM | POA: Diagnosis not present

## 2022-11-02 DIAGNOSIS — L239 Allergic contact dermatitis, unspecified cause: Secondary | ICD-10-CM | POA: Diagnosis not present

## 2022-11-24 ENCOUNTER — Other Ambulatory Visit: Payer: Self-pay | Admitting: Student

## 2022-11-24 NOTE — Progress Notes (Signed)
Received we can page or call from patient.  Return call back to patient.  Patient returned call, and patient reported that she recently was diagnosed with COVID.  She states her symptoms started yesterday.  She took a COVID test, and it was positive.  She reports she was having a sick contact with her granddaughter.  Patient reports having body aches, cough, chills.  She denies any shortness of breath.  She states she does have some sputum production.  She reports that she has been taking her nasal spray which has been helping.  She notes that she is currently in [place], and wants Paxlovid.  Unfortunately, I cannot prescribe medication in [place].  Did direct patient to urgent care, which patient states she will go to.  Did also give her information about symptoms to look out for to direct her to the emergency department.

## 2022-12-04 ENCOUNTER — Encounter: Payer: Self-pay | Admitting: Internal Medicine

## 2022-12-18 ENCOUNTER — Encounter: Payer: Self-pay | Admitting: Internal Medicine

## 2022-12-18 ENCOUNTER — Other Ambulatory Visit (HOSPITAL_COMMUNITY)
Admission: RE | Admit: 2022-12-18 | Discharge: 2022-12-18 | Disposition: A | Discharge status: Home / Self Care | Payer: Medicare HMO | Source: Ambulatory Visit | Attending: Internal Medicine | Admitting: Internal Medicine

## 2022-12-18 ENCOUNTER — Telehealth: Payer: Self-pay

## 2022-12-18 ENCOUNTER — Ambulatory Visit: Payer: Medicare HMO | Admitting: Internal Medicine

## 2022-12-18 ENCOUNTER — Other Ambulatory Visit: Payer: Self-pay

## 2022-12-18 VITALS — BP 129/72 | HR 82 | Temp 98.4°F | Ht 62.0 in | Wt 161.3 lb

## 2022-12-18 DIAGNOSIS — E663 Overweight: Secondary | ICD-10-CM

## 2022-12-18 DIAGNOSIS — Z01419 Encounter for gynecological examination (general) (routine) without abnormal findings: Secondary | ICD-10-CM | POA: Diagnosis not present

## 2022-12-18 DIAGNOSIS — Z131 Encounter for screening for diabetes mellitus: Secondary | ICD-10-CM

## 2022-12-18 DIAGNOSIS — Z7689 Persons encountering health services in other specified circumstances: Secondary | ICD-10-CM | POA: Insufficient documentation

## 2022-12-18 DIAGNOSIS — Z136 Encounter for screening for cardiovascular disorders: Secondary | ICD-10-CM | POA: Diagnosis not present

## 2022-12-18 DIAGNOSIS — Z8742 Personal history of other diseases of the female genital tract: Secondary | ICD-10-CM

## 2022-12-18 DIAGNOSIS — Z13220 Encounter for screening for lipoid disorders: Secondary | ICD-10-CM | POA: Diagnosis not present

## 2022-12-18 DIAGNOSIS — Z1159 Encounter for screening for other viral diseases: Secondary | ICD-10-CM

## 2022-12-18 DIAGNOSIS — M8580 Other specified disorders of bone density and structure, unspecified site: Secondary | ICD-10-CM | POA: Diagnosis not present

## 2022-12-18 DIAGNOSIS — G4700 Insomnia, unspecified: Secondary | ICD-10-CM | POA: Diagnosis not present

## 2022-12-18 DIAGNOSIS — Z780 Asymptomatic menopausal state: Secondary | ICD-10-CM

## 2022-12-18 DIAGNOSIS — Z23 Encounter for immunization: Secondary | ICD-10-CM

## 2022-12-18 DIAGNOSIS — Z1231 Encounter for screening mammogram for malignant neoplasm of breast: Secondary | ICD-10-CM

## 2022-12-18 DIAGNOSIS — T7840XA Allergy, unspecified, initial encounter: Secondary | ICD-10-CM | POA: Insufficient documentation

## 2022-12-18 DIAGNOSIS — J3089 Other allergic rhinitis: Secondary | ICD-10-CM

## 2022-12-18 DIAGNOSIS — Z114 Encounter for screening for human immunodeficiency virus [HIV]: Secondary | ICD-10-CM | POA: Insufficient documentation

## 2022-12-18 DIAGNOSIS — Z124 Encounter for screening for malignant neoplasm of cervix: Secondary | ICD-10-CM

## 2022-12-18 DIAGNOSIS — Z1151 Encounter for screening for human papillomavirus (HPV): Secondary | ICD-10-CM | POA: Insufficient documentation

## 2022-12-18 DIAGNOSIS — T7840XD Allergy, unspecified, subsequent encounter: Secondary | ICD-10-CM

## 2022-12-18 DIAGNOSIS — Z87898 Personal history of other specified conditions: Secondary | ICD-10-CM | POA: Diagnosis not present

## 2022-12-18 MED ORDER — RAMELTEON 8 MG PO TABS
8.0000 mg | ORAL_TABLET | Freq: Every day | ORAL | 11 refills | Status: DC
Start: 2022-12-18 — End: 2022-12-18

## 2022-12-18 MED ORDER — LEMBOREXANT 5 MG PO TABS: 5.0000 mg | ORAL_TABLET | Freq: Every evening | ORAL | 0 refills | Status: DC | PRN

## 2022-12-18 NOTE — Assessment & Plan Note (Signed)
Discussed vaccination for pneumonia with Prevnar given age 67. Tara Nunez has elected to think about this option.

## 2022-12-18 NOTE — Telephone Encounter (Signed)
Returned patient's call and discussed alternative medication for insomnia. Thank you.

## 2022-12-18 NOTE — Assessment & Plan Note (Signed)
Ms. 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 Previously took Ambien with good effect. She has since tried melatonin which no longer works. We discussed CBT-I today and she is open to trying this in addition to medication. I have reached out to our [facility] provider to inquire whether she provides this therapy and will refer if so. Ramelteon prescription sent to patient's pharmacy but not covered by insurance. Additional approved agents doxepin and suvorexant also not covered. Avoiding BZRAs such as zolpidem given patient age. Orexin antagonist lemborexant is covered and we discussed a trial today. Reviewed appropriate use and potential side effects.  Plan -Lemborexant 5 mg at bedtime prn -CBT-i

## 2022-12-18 NOTE — Progress Notes (Signed)
Subjective:   Patient ID: Tara Nunez female   DOB: 04-27-1955 67 y.o.   MRN: [id number]  HPI: Tara Nunez is a 67 y.o. female with PMH as below presenting to clinic for routine follow-up. Please see details of the visit under assessment and plan.    Past Medical History:  Diagnosis Date   Allergy    Cataract    Irritant contact dermatitis 11/13/2021   Current Outpatient Medications  Medication Sig Dispense Refill   cetirizine (ZYRTEC) 10 MG tablet Take 10 mg by mouth daily.     fluticasone (FLONASE) 50 MCG/ACT nasal spray Place 1 spray into both nostrils daily.     Lemborexant 5 MG TABS Take 1 tablet (5 mg total) by mouth at bedtime as needed (insomnia). 30 tablet 0   montelukast (SINGULAIR) 10 MG tablet Take 10 mg by mouth at bedtime.     triamcinolone (KENALOG) 0.025 % ointment Apply 1 Application topically 2 (two) times daily. 30 g 0   No current facility-administered medications for this visit.   Family History  Problem Relation Age of Onset   Stomach cancer Other    Colon cancer Neg Hx    Esophageal cancer Neg Hx    Rectal cancer Neg Hx    Social History   Socioeconomic History   Marital status: Unknown    Spouse name: Not on file   Number of children: Not on file   Years of education: Not on file   Highest education level: Not on file  Occupational History   Not on file  Tobacco Use   Smoking status: Former   Smokeless tobacco: Never  Vaping Use   Vaping status: Never Used  Substance and Sexual Activity   Alcohol use: Yes    Comment: Wine occasionally   Drug use: No   Sexual activity: Not on file  Other Topics Concern   Not on file  Social History Narrative   Not on file   Social Determinants of Health   Financial Resource Strain: Not on file  Food Insecurity: Not on file  Transportation Needs: Not on file  Physical Activity: Not on file  Stress: Not on file  Social Connections: Not on file   Review of Systems: ROS negative except for  what is noted on the assessment and plan.  Objective:  Physical Exam: Vitals:   12/18/22 1057  BP: 129/72  Pulse: 82  Temp: 98.4 F (36.9 C)  TempSrc: Oral  SpO2: 99%  Weight: 161 lb 4.8 oz (73.2 kg)  Height: 5\' 2"  (1.575 m)    General appearance: well appearing; wearing a mask  Eyes:  moist conjunctivae; tracking appropriately Lungs: CTAB, no crackles, no wheeze, with normal respiratory effort CV: RRR, no murmur Extremities: No peripheral edema Skin: Normal temperature; no rash Psych: Appropriate affect Neuro: Alert and oriented to person and place  Assessment & Plan:  Hx of abnormal cervical Pap smear Pt reports >25 years ago she had abnormal pap smear which required "freezing" of cells in two different occasions but has never been diagnosed with cervical cancer. Since then she got pap smears done every 1-3 years which have been normal. Her last pap smear w/ HPV cotesting was 2021 which was normal. Given her hx and age we collected additional sample today for screening. If normal, she does not require further screening. Nurse was present in the room during sample collection. Patient was comfortable and sample was collected with ease. Educated patient on potential spotting due to the procedure.  Plan  - Cytology w/ HPV  Hx of abnormal mammogram Patient reports she has a distant hx of getting biopsies for both left and right breast due to a mass found. She has never been diagnosed with breast cancer. She used to f/u with obgyn but has not seen them since they retired and COVID started.  - referral for mammogram   Allergies Pt. Reports she has chronic allergies. She uses fluticasone spray 2x a day, montelukast 10 mg every night, and cetrizine over the counter. She is allergic to dust and also has seasonal allergies. Has mentioned allergists about possible getting IM medications.   A/P - chronic issue  - Continue fluticasone 2x a day, monteleukast 10mg  and cetrizine - f/u  with allergist  Osteopenia after menopause DEXA previously performed at [facility], chart noting history of osteopenia. I do not see records available. I have asked Tara Nunez to bring these records to clinic for review, we may consider repeat DEXA for monitoring depending on degree of bone loss. Continue dietary calcium intake and discussed regular exercise.  Overweight (BMI 25.0-29.9) Discussed regular exercise with walking and healthy diet. Referral to nutritionist offered if patient is interested in the future. CTM.  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 Previously took Ambien with good effect. She has since tried melatonin which no longer works. We discussed CBT-I today and she is open to trying this in addition to medication. I have reached out to our [facility] provider to inquire whether she provides this therapy and will refer if so. Ramelteon prescription sent to patient's pharmacy but not covered by insurance. Additional approved agents doxepin and suvorexant also not covered. Avoiding BZRAs such as zolpidem given patient age. Orexin antagonist lemborexant is covered and we discussed a trial today. Reviewed appropriate use and potential side effects.  Plan -Lemborexant 5 mg at bedtime prn -CBT-i  Diabetes mellitus screening Screening for diabetes with fasting glucose today given overweight. No history of diabetes.  Plan -Fasting glucose  Encounter for lipid screening for cardiovascular disease Screening for hyperlipidemia given overweight.   Plan -Lipid profile  Encounter for screening for HIV One time screening for HIV.   Encounter for hepatitis C screening test for low risk patient One time screening for hepatitis C.  Need for vaccination against Streptococcus pneumoniae Discussed vaccination for pneumonia with Prevnar given age 74. Tara Nunez has elected to think  about this option.  Patient discussed with Dr. Sol Blazing

## 2022-12-18 NOTE — Assessment & Plan Note (Deleted)
Tara Nunez reported she is not able to sleep at night. She has tried melatonin in the past but it did not help. She has no consitutional sx, orthopnea, chest pain, or fatigue.   -Assessment  - less likely other causes given no other sx; less likely OSA given normal bp and no mention of snoring and fatigue  Plan  - Start Ramelteon 8mg  at night

## 2022-12-18 NOTE — Progress Notes (Signed)
Attestation for Student Documentation:  I personally was present and re-performed the history, physical exam and medical decision-making activities of this service and have verified that the service and findings are accurately documented in the student's note.  Dickie La, MD 12/18/2022, 3:48 PM

## 2022-12-18 NOTE — Assessment & Plan Note (Addendum)
Pt reports >25 years ago she had abnormal pap smear which required "freezing" of cells in two different occasions but has never been diagnosed with cervical cancer. Since then she got pap smears done every 1-3 years which have been normal. Her last pap smear w/ HPV cotesting was 2021 which was normal. Given her hx and age we collected additional sample today for screening. If normal, she does not require further screening. Nurse was present in the room during sample collection. Patient was comfortable and sample was collected with ease. Educated patient on potential spotting due to the procedure.   Plan  - Cytology w/ HPV

## 2022-12-18 NOTE — Assessment & Plan Note (Signed)
One time screening for HIV

## 2022-12-18 NOTE — Assessment & Plan Note (Signed)
Screening for hyperlipidemia given overweight.   Plan -Lipid profile

## 2022-12-18 NOTE — Telephone Encounter (Signed)
Pt states   ramelteon (ROZEREM) 8 MG tablet  Will not be covered by the insurance. Requesting to speak with a nurse about getting another medication. Please call pt back.

## 2022-12-18 NOTE — Assessment & Plan Note (Signed)
One-time screening for hepatitis C.

## 2022-12-18 NOTE — Assessment & Plan Note (Signed)
Discussed regular exercise with walking and healthy diet. Referral to nutritionist offered if patient is interested in the future. CTM.

## 2022-12-18 NOTE — Patient Instructions (Addendum)
Thank you, [name] for allowing [place] to provide your care today. Today we discussed the following.    Mammogram - we have sent a referral. Someone will contact you to schedule the appointment. Pap smear - We will call you with results. You might notice some spotting which is normal.  Vaccines - You can get the flu, COVID and shingles vaccines at your pharmacy.  Labs: We are checking your glucose, lipids and one time HIV and hepatitis lab tests.   I have ordered the following labs for you:   Lab Orders         HIV antibody (with reflex)         Hepatitis C Ab reflex to Quant PCR         Lipid Profile         Glucose Serum     Referrals ordered today:   Referral Orders  No referral(s) requested today     Thank you for trusting me with your care. Wishing you the best! [facility]

## 2022-12-18 NOTE — Assessment & Plan Note (Addendum)
Pt. Reports she has chronic allergies. She uses fluticasone spray 2x a day, montelukast 10 mg every night, and cetrizine over the counter. She is allergic to dust and also has seasonal allergies. Has mentioned allergists about possible getting IM medications.   A/P - chronic issue  - Continue fluticasone 2x a day, monteleukast 10mg  and cetrizine - f/u with allergist

## 2022-12-18 NOTE — Assessment & Plan Note (Signed)
DEXA previously performed at [facility], chart noting history of osteopenia. I do not see records available. I have asked Ms. Wengert to bring these records to clinic for review, we may consider repeat DEXA for monitoring depending on degree of bone loss. Continue dietary calcium intake and discussed regular exercise.

## 2022-12-18 NOTE — Assessment & Plan Note (Addendum)
Patient reports she has a distant hx of getting biopsies for both left and right breast due to a mass found. She has never been diagnosed with breast cancer. She used to f/u with obgyn but has not seen them since they retired and COVID started.  - referral for mammogram

## 2022-12-18 NOTE — Assessment & Plan Note (Signed)
Screening for diabetes with fasting glucose today given overweight. No history of diabetes.  Plan -Fasting glucose

## 2022-12-19 LAB — LIPID PANEL
Chol/HDL Ratio: 5.4 ratio — ABNORMAL HIGH (ref 0.0–4.4)
Cholesterol, Total: 206 mg/dL — ABNORMAL HIGH (ref 100–199)
HDL: 38 mg/dL — ABNORMAL LOW (ref >39)
LDL Chol Calc (NIH): 140 mg/dL — ABNORMAL HIGH (ref 0–99)
Triglycerides: 154 mg/dL — ABNORMAL HIGH (ref 0–149)
VLDL Cholesterol Cal: 28 mg/dL (ref 5–40)

## 2022-12-19 LAB — HIV ANTIBODY (ROUTINE TESTING W REFLEX): HIV Screen 4th Generation wRfx: NONREACTIVE

## 2022-12-19 LAB — CYTOLOGY - PAP
Comment: NEGATIVE
Diagnosis: NEGATIVE
High risk HPV: NEGATIVE

## 2022-12-19 LAB — HCV AB W REFLEX TO QUANT PCR: HCV Ab: NONREACTIVE

## 2022-12-19 LAB — GLUCOSE, RANDOM: Glucose: 100 mg/dL — ABNORMAL HIGH (ref 70–99)

## 2022-12-19 LAB — HCV INTERPRETATION

## 2022-12-19 NOTE — Addendum Note (Signed)
Addended by: Dickie La on: 12/19/2022 09:44 AM   Modules accepted: Orders

## 2022-12-20 ENCOUNTER — Telehealth: Payer: Self-pay | Admitting: *Deleted

## 2022-12-20 NOTE — Progress Notes (Signed)
Hyperlipidemia. ASCVD 6.7%. Discussed lifestyle modification with Ms. Avallone 8/29.

## 2022-12-20 NOTE — Progress Notes (Signed)
Negative HIV and hepatitis C screen. Reviewed with Ms. Asato 8/29,

## 2022-12-20 NOTE — Progress Notes (Signed)
Fasting glucose in prediabetes range. Discussed lifestyle modification with Tara Nunez 8/29.

## 2022-12-20 NOTE — Progress Notes (Signed)
  Care Coordination   Note   12/20/2022 Name: Tara Nunez MRN: [id number] DOB: 12/25/1955  Tara Nunez is a 67 y.o. year old female who sees Dickie La, MD for primary care. I reached out to Milus Mallick by phone today to offer care coordination services.  Tara Nunez was given information about Care Coordination services today including:   The Care Coordination services include support from the care team which includes your Nurse Coordinator, Clinical Social Worker, or Pharmacist.  The Care Coordination team is here to help remove barriers to the health concerns and goals most important to you. Care Coordination services are voluntary, and the patient may decline or stop services at any time by request to their care team member.   Care Coordination Consent Status: Patient agreed to services and verbal consent obtained.   Follow up plan:  Telephone appointment with care coordination team member scheduled for:  12/27/22  Encounter Outcome: patient scheduled   [facility] Coordination Care Guide  Direct Dial: [phone]

## 2022-12-27 ENCOUNTER — Encounter: Payer: Self-pay | Admitting: *Deleted

## 2022-12-27 ENCOUNTER — Telehealth: Payer: Self-pay | Admitting: *Deleted

## 2022-12-27 NOTE — Patient Outreach (Signed)
  Care Coordination   12/27/2022 Name: Tara Nunez MRN: [id number] DOB: 09-25-1955   Care Coordination Outreach Attempts:  An unsuccessful telephone outreach was attempted today to offer the patient information about available care coordination services.  Follow Up Plan:  Additional outreach attempts will be made to offer the patient care coordination information and services.   Encounter Outcome:  No Answer   Care Coordination Interventions:  No, not indicated    Reece Levy, MSW, LCSW Clinical Social Worker [phone]

## 2022-12-28 ENCOUNTER — Telehealth: Payer: Self-pay | Admitting: *Deleted

## 2022-12-28 NOTE — Progress Notes (Unsigned)
  Care Coordination Note  12/28/2022 Name: Tara Nunez MRN: [id number] DOB: [birth year]  Tara Nunez is a 67 y.o. year old female who is a primary care patient of Dickie La, MD and is actively engaged with the care management team. I reached out to Milus Mallick by phone today to assist with re-scheduling an initial visit with the Licensed Clinical Social Worker  Follow up plan: Unsuccessful telephone outreach attempt made.   [facility]  Care Coordination Care Guide  Direct Dial: [phone]

## 2022-12-31 NOTE — Progress Notes (Signed)
  Care Coordination Note  12/31/2022 Name: [name] MRN: [id number] DOB: 1955/10/04  Tara Nunez is a 67 y.o. year old female who is a primary care patient of Dickie La, MD and is actively engaged with the care management team. I reached out to Milus Mallick by phone today to assist with re-scheduling an initial visit with the Licensed Clinical Social Worker  Follow up plan: Unsuccessful telephone outreach attempt made. A HIPAA compliant phone message was left for the patient providing contact information and requesting a return call.  We have been unable to make contact with the patient for follow up. The care management team is available to follow up with the patient after provider conversation with the patient regarding recommendation for care management engagement and subsequent re-referral to the care management team.   [facility] Coordination Care Guide  Direct Dial: [phone]

## 2023-02-07 ENCOUNTER — Telehealth: Payer: Self-pay | Admitting: *Deleted

## 2023-02-07 DIAGNOSIS — H0100B Unspecified blepharitis left eye, upper and lower eyelids: Secondary | ICD-10-CM | POA: Diagnosis not present

## 2023-02-07 DIAGNOSIS — H0100A Unspecified blepharitis right eye, upper and lower eyelids: Secondary | ICD-10-CM | POA: Diagnosis not present

## 2023-02-07 NOTE — Telephone Encounter (Signed)
Agree that patient will need in-person evaluation for symptoms. Okay for urgent care if no [facility] appointments are available. Thank you.

## 2023-02-07 NOTE — Telephone Encounter (Signed)
Call from patient states her eyes are crusty and red .  Wants to get an antibiotic for. Patient requested an appointment today.  Will go to Urgent Care.

## 2023-02-11 DIAGNOSIS — L239 Allergic contact dermatitis, unspecified cause: Secondary | ICD-10-CM | POA: Diagnosis not present

## 2023-02-12 DIAGNOSIS — L209 Atopic dermatitis, unspecified: Secondary | ICD-10-CM | POA: Diagnosis not present

## 2023-02-12 DIAGNOSIS — L509 Urticaria, unspecified: Secondary | ICD-10-CM | POA: Diagnosis not present

## 2023-02-12 DIAGNOSIS — J3081 Allergic rhinitis due to animal (cat) (dog) hair and dander: Secondary | ICD-10-CM | POA: Diagnosis not present

## 2023-02-12 DIAGNOSIS — J301 Allergic rhinitis due to pollen: Secondary | ICD-10-CM | POA: Diagnosis not present

## 2023-02-12 DIAGNOSIS — R053 Chronic cough: Secondary | ICD-10-CM | POA: Diagnosis not present

## 2023-02-12 DIAGNOSIS — H1013 Acute atopic conjunctivitis, bilateral: Secondary | ICD-10-CM | POA: Diagnosis not present

## 2023-03-13 ENCOUNTER — Telehealth: Payer: Self-pay

## 2023-03-13 DIAGNOSIS — G4700 Insomnia, unspecified: Secondary | ICD-10-CM

## 2023-03-13 NOTE — Telephone Encounter (Signed)
Patient called she stated during her last ov you guys discussed her getting a mammogram. Patient is asking for the referral to be sent.

## 2023-03-13 NOTE — Telephone Encounter (Signed)
The order was placed at last office visit. She will need to call the [facility] to schedule. Please let her know. Thank you.

## 2023-03-14 ENCOUNTER — Telehealth: Payer: Self-pay | Admitting: *Deleted

## 2023-03-14 NOTE — Progress Notes (Signed)
  Care Coordination  Outreach Note  03/14/2023 Name: Tara Nunez MRN: [id number] DOB: Jun 26, 1955   Care Coordination Outreach Attempts: An unsuccessful telephone outreach was attempted today to offer the patient information about available care coordination services.  Follow Up Plan:  Additional outreach attempts will be made to offer the patient care coordination information and services.   Encounter Outcome:  No Answer  Christie Nottingham  Care Coordination Care Guide  Direct Dial: [phone]

## 2023-03-14 NOTE — Telephone Encounter (Signed)
Pt calling to ask for another Referral to be placed to [facility] for her insomina.  Per notes below the pt was called several times and never responded.  Pt apologizes for not answering her phone and setting up an appt with the [facility] Team.  Type Date User Summary Attachment  General 12/31/2022  8:56 AM Snead, [employer], NT  Plan: 3 or more unsuccessful contact attempts have been made and the provider has been notified. The care management team will attempt to engage the patient again when/if another referral is made.      Can a new order be placed again to  the [facility] [id number]?   Also the patient has already been called from the [facility] per their notes below and she is not answering her phone.  [facility] Notes Below:  01/18/23 2nd attempt/ lmom/ fj 01/03/23 1st attempt lMOM EPIC [employer]

## 2023-03-18 DIAGNOSIS — L209 Atopic dermatitis, unspecified: Secondary | ICD-10-CM | POA: Diagnosis not present

## 2023-03-18 NOTE — Progress Notes (Signed)
  Care Coordination   Note   03/18/2023 Name: Iyania Wrightsman MRN: [id number] DOB: Jan 22, 1956  Younique My is a 67 y.o. year old female who sees Dickie La, MD for primary care. I reached out to Milus Mallick by phone today to offer care coordination services.  Ms. Shoenfelt was given information about Care Coordination services today including:   The Care Coordination services include support from the care team which includes your Nurse Coordinator, Clinical Social Worker, or Pharmacist.  The Care Coordination team is here to help remove barriers to the health concerns and goals most important to you. Care Coordination services are voluntary, and the patient may decline or stop services at any time by request to their care team member.   Care Coordination Consent Status: Patient agreed to services and verbal consent obtained.   Follow up plan:  Telephone appointment with care coordination team member scheduled for:  03/25/23  Encounter Outcome:  Patient Scheduled  [facility] Coordination Care Guide  Direct Dial: [phone]

## 2023-03-20 ENCOUNTER — Ambulatory Visit
Admission: RE | Admit: 2023-03-20 | Discharge: 2023-03-20 | Disposition: A | Discharge status: Home / Self Care | Payer: Medicare HMO | Source: Ambulatory Visit | Attending: Internal Medicine

## 2023-03-20 DIAGNOSIS — Z1231 Encounter for screening mammogram for malignant neoplasm of breast: Secondary | ICD-10-CM | POA: Diagnosis not present

## 2023-03-20 DIAGNOSIS — Z87898 Personal history of other specified conditions: Secondary | ICD-10-CM

## 2023-03-25 ENCOUNTER — Ambulatory Visit: Payer: Self-pay | Admitting: *Deleted

## 2023-03-25 ENCOUNTER — Encounter: Payer: Self-pay | Admitting: *Deleted

## 2023-03-25 NOTE — Patient Instructions (Signed)
Visit Information  Thank you for taking time to visit with me today. Please don't hesitate to contact me if I can be of assistance to you.   Following are the goals we discussed today:   Goals Addressed             This Visit's Progress    To sleep better       Activities and task to complete in order to accomplish goals.   Expect call from me once provider identified for insomnia and then schedule your appointment. Start / continue relaxed breathing 3 times daily Keep all upcoming appointment discussed today Continue with compliance of taking medication prescribed by Doctor         Our next appointment is by telephone on 04/02/23  Please call the care guide team at [phone] if you need to cancel or reschedule your appointment.   If you are experiencing a Mental Health or Behavioral Health Crisis or need someone to talk to, please call the Suicide and Crisis Lifeline: 988 call 911   The patient verbalized understanding of instructions, educational materials, and care plan provided today and DECLINED offer to receive copy of patient instructions, educational materials, and care plan.   Telephone follow up appointment with care management team member scheduled for: 04/02/23 Reece Levy, MSW, LCSW [place]  [facility], [facility] Licensed Clinical Social Worker Care Coordinator  [phone]

## 2023-03-25 NOTE — Patient Outreach (Signed)
  Care Coordination   Initial Visit Note   03/25/2023 Name: Tara Nunez MRN: [id number] DOB: [birth year]  Tara Nunez is a 67 y.o. year old female who sees Dickie La, MD for primary care. I spoke with  Milus Mallick by phone today.  What matters to the patients health and wellness today?  "Cannot sleep more than 4 hours"    Goals Addressed             This Visit's Progress    To sleep better       Activities and task to complete in order to accomplish goals.   Expect call from me once provider identified for insomnia and then schedule your appointment. Start / continue relaxed breathing 3 times daily Keep all upcoming appointment discussed today Continue with compliance of taking medication prescribed by Doctor         SDOH assessments and interventions completed:  Yes  SDOH Interventions Today    Flowsheet Row Most Recent Value  SDOH Interventions   Food Insecurity Interventions Intervention Not Indicated  Housing Interventions Intervention Not Indicated  Transportation Interventions Intervention Not Indicated  Utilities Interventions Intervention Not Indicated  Alcohol Usage Interventions Intervention Not Indicated (Score <7)  Health Literacy Interventions Intervention Not Indicated        Care Coordination Interventions:  Yes, provided  Interventions Today    Flowsheet Row Most Recent Value  Chronic Disease   Chronic disease during today's visit Other  [insomnia]  General Interventions   General Interventions Discussed/Reviewed General Interventions Discussed, General Interventions Reviewed, Community Resources  Mental Health Interventions   Mental Health Discussed/Reviewed Mental Health Discussed  [Pt denies depression and scored low symptoms for PHQ9. She also denies anxiety or worry. She does admit to dealing with insomnia for 20+ years- she has tried RX including Ambien "which use to work". Pt would like to pursue a specialist to help with this-]   Nutrition Interventions   Nutrition Discussed/Reviewed Nutrition Discussed  Pharmacy Interventions   Pharmacy Dicussed/Reviewed Pharmacy Topics Discussed  Safety Interventions   Safety Discussed/Reviewed Safety Discussed  [Pt lives alone. Denies SI.]       Follow up plan: Follow up call scheduled for 04/02/23    Encounter Outcome:  Patient Visit Completed

## 2023-04-01 DIAGNOSIS — L209 Atopic dermatitis, unspecified: Secondary | ICD-10-CM | POA: Diagnosis not present

## 2023-04-02 ENCOUNTER — Ambulatory Visit: Payer: Self-pay | Admitting: *Deleted

## 2023-04-02 NOTE — Patient Instructions (Signed)
Visit Information  Thank you for taking time to visit with me today. Please don't hesitate to contact me if I can be of assistance to you.   Following are the goals we discussed today:   Goals Addressed             This Visit's Progress    To sleep better       Activities and task to complete in order to accomplish goals.   Expect phone call re: sleep consult- I am asking PCP to make referral to [facility] Sleep Medicine Expect call from me once provider identified for insomnia and then schedule your appointment. Start / continue relaxed breathing 3 times daily Keep wearing your "Amber glasses" to help with sleep Keep all upcoming appointment discussed today Continue with compliance of taking medication prescribed by Doctor         Our next appointment is by telephone on 04/11/23    Please call the care guide team at [phone] if you need to cancel or reschedule your appointment.   If you are experiencing a Mental Health or Behavioral Health Crisis or need someone to talk to, please call the Suicide and Crisis Lifeline: 988 call 911   The patient verbalized understanding of instructions, educational materials, and care plan provided today and DECLINED offer to receive copy of patient instructions, educational materials, and care plan.   Telephone follow up appointment with care management team member scheduled for: 04/11/23  Reece Levy, MSW, LCSW [place], [facility] Licensed Clinical Social Worker Care Coordinator  [phone]

## 2023-04-02 NOTE — Patient Outreach (Signed)
  Care Coordination   Follow Up Visit Note   04/02/2023 Name: Tequila Escovar MRN: [id number] DOB: 17-Dec-1955  Jamoni Kennan is a 67 y.o. year old female who sees Dickie La, MD for primary care. I spoke with  Milus Mallick by phone today.  What matters to the patients health and wellness today?  Slept last night!    Goals Addressed             This Visit's Progress    To sleep better       Activities and task to complete in order to accomplish goals.   Expect phone call re: sleep consult- I am asking PCP to make referral to [facility] Sleep Medicine Expect call from me once provider identified for insomnia and then schedule your appointment. Start / continue relaxed breathing 3 times daily Keep wearing your "Amber glasses" to help with sleep Keep all upcoming appointment discussed today Continue with compliance of taking medication prescribed by Doctor         SDOH assessments and interventions completed:  Yes     Care Coordination Interventions:  Yes, provided  Interventions Today    Flowsheet Row Most Recent Value  Chronic Disease   Chronic disease during today's visit Other  [insomnia]  General Interventions   General Interventions Discussed/Reviewed Doctor Visits  Doctor Visits Discussed/Reviewed Specialist  [Pt is interested in a referral to a sleep specialist- CSW will ask PCP to make referral to [facility] Sleep Medicine]  Mental Health Interventions   Mental Health Discussed/Reviewed Mental Health Discussed, Coping Strategies  [Pt reports her sleeping has been better for a few nights recently-using her "amber glasses and ate a gummie". denies any other mental health concerns or needs]       Follow up plan: Follow up call scheduled for 04/11/23    Encounter Outcome:  Patient Visit Completed

## 2023-04-08 ENCOUNTER — Telehealth: Payer: Self-pay | Admitting: Internal Medicine

## 2023-04-08 DIAGNOSIS — G4700 Insomnia, unspecified: Secondary | ICD-10-CM

## 2023-04-08 NOTE — Telephone Encounter (Signed)
Rec'a message about placing a referral per Reece Levy.  Please see message below for possible counseling:  From: Leisa Lenz  Sent: 04/02/2023   1:59 PM EST  To: Dickie La, MD   Dr Sol Blazing, Ms Tara Nunez is interested in referral to [facility] Sleep Medicine. I confirmed they are taking new patients and they will assess and refer to CSW if needed for "CBT-I".  Can you make referral to them/have progress notes, etc faxed to them with orders?  Lauren Allred. Office Manager  fax [phone]  Option 3   Here are the particulars for the practice:  [facility] Sleep Medicine  ________________________________________  [address],  [place], [place] [address]     Call: [phone]    Fax: [phone]    Monday-Thursday 8am-5:30pm

## 2023-04-11 ENCOUNTER — Ambulatory Visit: Payer: Self-pay | Admitting: *Deleted

## 2023-04-11 NOTE — Patient Outreach (Signed)
  Care Coordination   Follow Up Visit Note   04/11/2023 Name: Katrenia Copenhaver MRN: [id number] DOB: November 26, 1955  Jannene Zelazny is a 67 y.o. year old female who sees Dickie La, MD for primary care. I spoke with  Milus Mallick by phone today.  What matters to the patients health and wellness today? The "amber glasses" are helping with sleep.     Goals Addressed             This Visit's Progress    To sleep better       Activities and task to complete in order to accomplish goals.   Expect phone call re: sleep specialist consult to schedule your appointment. Start / continue relaxed breathing 3 times daily Keep wearing your "Amber glasses" to help with sleep Keep all upcoming appointment discussed today Continue with compliance of taking medication prescribed by Doctor   Here are the particulars for the practice:  [facility]  ________________________________________  [address],  [place], [place] [address]     Call: [phone]    Fax: [phone]    Monday-Thursday 8am-5:30pm   Enjoy family time in [place]!!        SDOH assessments and interventions completed:  Yes     Care Coordination Interventions:  Yes, provided  Interventions Today    Flowsheet Row Most Recent Value  Chronic Disease   Chronic disease during today's visit --  [insomnia]  General Interventions   Doctor Visits Discussed/Reviewed Specialist  [Referral placed by PCP for sleep specialist- pt plans to call to get scheduled.]  Mental Health Interventions   Mental Health Discussed/Reviewed Mental Health Discussed, Other  [Pt reports wearing the "amber glasses" and has been sleeping better. she is optimistic this is helping- sometimes forgets to put them on (2 hours before bedtime). She is looking forward to going to [place] in January to help with 3yo and 5yo grandkids-]       Follow up plan: Follow up call scheduled for 05/16/23 with newly assigned CSW, Gwyndolyn Saxon,  LCSW    Encounter Outcome:  Patient Visit Completed

## 2023-04-11 NOTE — Patient Instructions (Signed)
Visit Information  Thank you for taking time to visit with me today. Please don't hesitate to contact me if I can be of assistance to you.   Following are the goals we discussed today:   Goals Addressed             This Visit's Progress    To sleep better       Activities and task to complete in order to accomplish goals.   Expect phone call re: sleep specialist consult to schedule your appointment. Start / continue relaxed breathing 3 times daily Keep wearing your "Amber glasses" to help with sleep Keep all upcoming appointment discussed today Continue with compliance of taking medication prescribed by Doctor   Here are the particulars for the practice:  [facility] Sleep Medicine  ________________________________________  [address], [address],  [place], [place] [address]     Call: [phone]    Fax: [phone]    Monday-Thursday 8am-5:30pm   Enjoy family time in [place]!!        Our next appointment is by telephone on 05/16/23    Please call the care guide team at [phone] if you need to cancel or reschedule your appointment.   If you are experiencing a Mental Health or Behavioral Health Crisis or need someone to talk to, please call the Suicide and Crisis Lifeline: 988 call 911   Patient verbalizes understanding of instructions and care plan provided today and agrees to view in MyChart. Active MyChart status and patient understanding of how to access instructions and care plan via MyChart confirmed with patient.     Telephone follow up appointment with care management team member scheduled for: 05/16/23 with newly assigned CSW, Sue Lush.  Reece Levy, MSW, LCSW [place], [facility] Licensed Clinical Social Worker Care Coordinator  [phone]

## 2023-04-15 DIAGNOSIS — L209 Atopic dermatitis, unspecified: Secondary | ICD-10-CM | POA: Diagnosis not present

## 2023-04-30 DIAGNOSIS — L209 Atopic dermatitis, unspecified: Secondary | ICD-10-CM | POA: Diagnosis not present

## 2023-05-15 DIAGNOSIS — G4700 Insomnia, unspecified: Secondary | ICD-10-CM | POA: Diagnosis not present

## 2023-05-15 DIAGNOSIS — F17211 Nicotine dependence, cigarettes, in remission: Secondary | ICD-10-CM | POA: Diagnosis not present

## 2023-05-15 DIAGNOSIS — Z6829 Body mass index (BMI) 29.0-29.9, adult: Secondary | ICD-10-CM | POA: Diagnosis not present

## 2023-05-15 DIAGNOSIS — Z008 Encounter for other general examination: Secondary | ICD-10-CM | POA: Diagnosis not present

## 2023-05-15 DIAGNOSIS — E663 Overweight: Secondary | ICD-10-CM | POA: Diagnosis not present

## 2023-05-15 DIAGNOSIS — Z860100 Personal history of colon polyps, unspecified: Secondary | ICD-10-CM | POA: Diagnosis not present

## 2023-05-16 ENCOUNTER — Ambulatory Visit: Payer: Self-pay | Admitting: Licensed Clinical Social Worker

## 2023-05-16 NOTE — Patient Outreach (Signed)
  Care Coordination   05/16/2023 Name: Tara Nunez MRN: [id number] DOB: Aug 01, 1955   Care Coordination Outreach Attempts:  An unsuccessful telephone outreach was attempted today to offer the patient information about available complex care management services.  Follow Up Plan:  Additional outreach attempts will be made to offer the patient complex care management information and services.   Encounter Outcome:  No Answer   Care Coordination Interventions:  No, not indicated    Called phone numbers listed in epic for patient. LCSW A. Felton Clinton did not leave voicemail due to message recording stating number belongs to [employer]. LCSW A. Felton Clinton did not find DPR granting permission to leave message with Tammy Sours Lipford.   Gwyndolyn Saxon MSW, LCSW Licensed Clinical Social Worker  [facility], Population Health Direct Dial: [phone]  Fax: [phone]

## 2023-05-22 ENCOUNTER — Telehealth: Payer: Self-pay | Admitting: *Deleted

## 2023-05-22 NOTE — Progress Notes (Signed)
Complex Care Management Care Guide Note  05/22/2023 Name: Tara Nunez MRN: [id number] DOB: 1955-09-06  Tara Nunez is a 68 y.o. year old female who is a primary care patient of Dickie La, MD and is actively engaged with the care management team. I reached out to Milus Mallick by phone today to assist with re-scheduling  with the Licensed Clinical Child psychotherapist.  Follow up plan: Unsuccessful telephone outreach attempt made. A HIPAA compliant phone message was left for the patient providing contact information and requesting a return call.  Burman Nieves, CMA, Care Guide [facility]  [facility], [facility] Guide Direct Dial: [phone]  Fax: [phone] Website: [email or web address]

## 2023-05-28 NOTE — Progress Notes (Signed)
 Complex Care Management Care Guide Note  05/28/2023 Name: Tara Nunez MRN: [id number] DOB: August 17, 1955  Tara Nunez is a 67 y.o. year old female who is a primary care patient of Karna Fellows, MD and is actively engaged with the care management team. I reached out to Lavern Shoulder by phone today to assist with re-scheduling  with the BSW.  Follow up plan: Unsuccessful telephone outreach attempt made. A HIPAA compliant phone message was left for the patient providing contact information and requesting a return call. No additional outreach attempts will be made due to inability to maintain patient contact.   Thedford Franks, CMA, Care Guide [facility]  [facility], [facility] Guide Direct Dial: [phone]  Fax: [phone] Website: [email or web address]

## 2023-06-26 ENCOUNTER — Telehealth: Payer: Self-pay

## 2023-06-26 ENCOUNTER — Ambulatory Visit (INDEPENDENT_AMBULATORY_CARE_PROVIDER_SITE_OTHER): Payer: Self-pay | Admitting: Student

## 2023-06-26 ENCOUNTER — Encounter: Payer: Self-pay | Admitting: Student

## 2023-06-26 VITALS — Temp 98.6°F | Ht 62.0 in | Wt 161.1 lb

## 2023-06-26 DIAGNOSIS — R42 Dizziness and giddiness: Secondary | ICD-10-CM | POA: Insufficient documentation

## 2023-06-26 DIAGNOSIS — I951 Orthostatic hypotension: Secondary | ICD-10-CM | POA: Diagnosis not present

## 2023-06-26 MED ORDER — MECLIZINE HCL 12.5 MG PO TABS: 12.5000 mg | ORAL_TABLET | Freq: Two times a day (BID) | ORAL | 0 refills | Status: DC | PRN

## 2023-06-26 NOTE — Progress Notes (Signed)
    CC: dizziness  HPI:  Tara Nunez is a 68 y.o. female with pertinent PMH of insomnia who presents to the clinic for dizziness. Please see assessment and plan below for further details.  Review of Systems:   Pertinent items noted in HPI and/or A&P.  Physical Exam:  Vitals:   06/26/23 0953  Temp: 98.6 F (37 C)  TempSrc: Oral  Weight: 161 lb 1.6 oz (73.1 kg)  Height: 5\' 2"  (1.575 m)   Well-appearing middle-aged woman in no acute distress Heart rate and rhythm regular, euvolemic Normal respiratory rate and effort No focal neurological deficits, no nystagmus, no cranial nerve deficits, no focal weakness  Assessment & Plan:   Dizziness She presents to the clinic for an acute visit for dizziness.  Seems like she may have 2 things going on simultaneously.  The first is orthostatic hypotension.  Her orthostatics were positive.  She does report a long history of lightheadedness, especially with changing positions like leaning over or getting up.  She is not on any medications that could be causing orthostasis.  She says she is drinking about a liter of water per day.  We will check labs today including a CBC, CMP, and TSH to evaluate for other possible causes of orthostasis.  I have instructed her to use compression stockings and to increase her salt and water intake.  She should also take care when changing positions.  In addition to this, she reports a few recent new acute episodes of severe dizziness and nausea within the past week.  The most severe episode was on Friday and began when she woke up and sat up in bed.  She felt like the whole room was spinning all around her and she was unable to get out of bed.  She also had nausea.  She is not sure how long this lasted, but it felt like it lasted a really long time.  At any rate, it is intermittent and resolved on its own.  She had not had dizziness like this before.  There was another episode recently when she was on the toilet and  she felt as though the furniture around her was spinning and she was unable to focus on it.  She notes that sometimes she feels dizzy when she moves her head too quickly.  Her mother had "inner ear syndrome" which sounds like Mnire disease per her description.  She does not have any hearing changes from her baseline, which is hearing aids in the setting of hearing loss.  No recent viral illnesses.  She is not have any ear fullness or tinnitus.  I am most suspicious for possible BPPV, so I have placed a referral for vestibular therapy.  I have also prescribed her meclizine to take for symptomatic relief of her dizziness during these episodes.  We will see what the labs show and see how her symptoms respond to these interventions.    Patient discussed with Dr. Sol Blazing.   Annett Fabian, MD [facility] Center Internal Medicine Resident PGY-1 Clinic Phone: [phone] Pager: [phone]

## 2023-06-26 NOTE — Telephone Encounter (Addendum)
 Pt wants to know which compression stockings to buy - firm or moderate? Cost $30.00 and she stated she wants to buy the right ones. Stated she asked the pharmacist and was told to call her doctor.

## 2023-06-26 NOTE — Patient Instructions (Addendum)
 Thank you, Ms.Milus Mallick for allowing [place] to provide your care today.   We will collect a few labs today including a complete blood count, complete metabolic panel, and a thyroid stimulating hormone lab.  I will call you soon as the results.  As we discussed, her blood pressure test today showed that you have orthostatic hypotension.  Please wear compression stockings every day for 1 week, increase the amount of salt in your diet, and take care when changing position.  Hopefully this helps relieve some of your symptoms.  Please also increase your daily water intake as we discussed.  Your symptoms sound like you may have BPPV, benign paroxysmal positional vertigo.  I prescribed a medication called meclizine, which you can take twice daily as needed for episodes of dizziness.  I also put in a referral to vestibular therapy.  They should be contacting you to schedule an appointment.    If your symptoms do not improve following vestibular therapy, please give [place] a call and we will proceed with further workup.  I have ordered the following labs for you:  Lab Orders         CBC no Diff         CMP14 + Anion Gap         TSH        I have ordered the following medication/changed the following medications:   Start the following medications: Meds ordered this encounter  Medications   meclizine (ANTIVERT) 12.5 MG tablet    Sig: Take 1 tablet (12.5 mg total) by mouth 2 (two) times daily as needed for dizziness.    Dispense:  30 tablet    Refill:  0     Follow up: 3 months  We look forward to seeing you next time. Please call our clinic at [phone] if you have any questions or concerns. The best time to call is Monday-Friday from 9am-4pm, but there is someone available 24/7. If after hours or the weekend, call the main hospital number and ask for the Internal Medicine Resident On-Call. If you need medication refills, please notify your pharmacy one week in advance and they will send [place] a  request.   Thank you for trusting me with your care. Wishing you the best!   Annett Fabian, MD [facility] Internal Medicine Center

## 2023-06-26 NOTE — Assessment & Plan Note (Addendum)
 She presents to the clinic for an acute visit for dizziness.  Seems like she may have 2 things going on simultaneously.  The first is orthostatic hypotension.  Her orthostatics were positive.  She does report a long history of lightheadedness, especially with changing positions like leaning over or getting up.  She is not on any medications that could be causing orthostasis.  She says she is drinking about a liter of water per day.  We will check labs today including a CBC, CMP, and TSH to evaluate for other possible causes of orthostasis.  I have instructed her to use compression stockings and to increase her salt and water intake.  She should also take care when changing positions.  In addition to this, she reports a few recent new acute episodes of severe dizziness and nausea within the past week.  The most severe episode was on Friday and began when she woke up and sat up in bed.  She felt like the whole room was spinning all around her and she was unable to get out of bed.  She also had nausea.  She is not sure how long this lasted, but it felt like it lasted a really long time.  At any rate, it is intermittent and resolved on its own.  She had not had dizziness like this before.  There was another episode recently when she was on the toilet and she felt as though the furniture around her was spinning and she was unable to focus on it.  She notes that sometimes she feels dizzy when she moves her head too quickly.  Her mother had "inner ear syndrome" which sounds like Mnire disease per her description.  She does not have any hearing changes from her baseline, which is hearing aids in the setting of hearing loss.  No recent viral illnesses.  She is not have any ear fullness or tinnitus.  I am most suspicious for possible BPPV, so I have placed a referral for vestibular therapy.  I have also prescribed her meclizine to take for symptomatic relief of her dizziness during these episodes.  We will see what  the labs show and see how her symptoms respond to these interventions.

## 2023-06-26 NOTE — Telephone Encounter (Signed)
 Questions about the size of the compression socks. Will forward message to triage nurse.

## 2023-06-27 ENCOUNTER — Encounter: Payer: Self-pay | Admitting: *Deleted

## 2023-06-27 LAB — CMP14 + ANION GAP
ALT: 11 IU/L (ref 0–32)
AST: 17 IU/L (ref 0–40)
Albumin: 4.7 g/dL (ref 3.9–4.9)
Alkaline Phosphatase: 100 IU/L (ref 44–121)
Anion Gap: 20 mmol/L — ABNORMAL HIGH (ref 10.0–18.0)
BUN/Creatinine Ratio: 23 (ref 12–28)
BUN: 20 mg/dL (ref 8–27)
Bilirubin Total: 0.5 mg/dL (ref 0.0–1.2)
CO2: 21 mmol/L (ref 20–29)
Calcium: 9.9 mg/dL (ref 8.7–10.3)
Chloride: 102 mmol/L (ref 96–106)
Creatinine, Ser: 0.86 mg/dL (ref 0.57–1.00)
Globulin, Total: 3.6 g/dL (ref 1.5–4.5)
Glucose: 98 mg/dL (ref 70–99)
Potassium: 4.2 mmol/L (ref 3.5–5.2)
Sodium: 143 mmol/L (ref 134–144)
Total Protein: 8.3 g/dL (ref 6.0–8.5)
eGFR: 74 mL/min/{1.73_m2} (ref >59)

## 2023-06-27 LAB — CBC
Hematocrit: 43.7 % (ref 34.0–46.6)
Hemoglobin: 14.5 g/dL (ref 11.1–15.9)
MCH: 30.3 pg (ref 26.6–33.0)
MCHC: 33.2 g/dL (ref 31.5–35.7)
MCV: 91 fL (ref 79–97)
Platelets: 250 10*3/uL (ref 150–450)
RBC: 4.78 x10E6/uL (ref 3.77–5.28)
RDW: 12.3 % (ref 11.7–15.4)
WBC: 8 10*3/uL (ref 3.4–10.8)

## 2023-06-27 LAB — TSH: TSH: 3.23 u[IU]/mL (ref 0.450–4.500)

## 2023-06-27 NOTE — Progress Notes (Signed)
 Internal Medicine Clinic Attending  Case discussed with the resident at the time of the visit.  We reviewed the resident's history and exam and pertinent patient test results.  I agree with the assessment, diagnosis, and plan of care documented in the resident's note.

## 2023-06-27 NOTE — Telephone Encounter (Signed)
 Pt was called and informed to buy the firm compression stockings per Dr Versie Starks.

## 2023-07-01 ENCOUNTER — Encounter: Payer: Self-pay | Admitting: Student

## 2023-07-01 ENCOUNTER — Telehealth: Payer: Self-pay | Admitting: Internal Medicine

## 2023-07-01 DIAGNOSIS — R42 Dizziness and giddiness: Secondary | ICD-10-CM

## 2023-07-01 NOTE — Telephone Encounter (Signed)
 Copied from CRM [id number]. Topic: Clinical - Medical Advice >> Jul 01, 2023 11:17 AM Hector Shade B wrote: Reason for CRM: Patient stated that she is wondering if the provider has submitted paperwork for therapy due to the dizziness that she has been experiencing. Patient contact information is [phone] >> Jul 01, 2023 11:45 AM Nurse Sherlean Foot wrote: Message sent to referral co-ordinator,.    Called and spoke with the pt to clarify what order was needed.  Pt verbalized understanding that the referral was not placed correctly   Please place a correct order for the following as the current PT vestibular rehab (Order [id number]) PT Date: 06/26/2023 Department: [facility] [place] Ordering: Annett Fabian, MD Authorizing: Dickie La, MD   That was placed can not be seen in the Referral WQ.  Please place a New Referral the following WQ [facility] .

## 2023-07-01 NOTE — Telephone Encounter (Signed)
 Order for referral to Neuro Rehab placed. Thank you.

## 2023-07-03 ENCOUNTER — Encounter: Payer: Self-pay | Admitting: Physical Therapy

## 2023-07-03 ENCOUNTER — Ambulatory Visit: Attending: Internal Medicine | Admitting: Physical Therapy

## 2023-07-03 ENCOUNTER — Other Ambulatory Visit: Payer: Self-pay

## 2023-07-03 VITALS — BP 106/68 | HR 92

## 2023-07-03 DIAGNOSIS — R42 Dizziness and giddiness: Secondary | ICD-10-CM | POA: Insufficient documentation

## 2023-07-03 NOTE — Therapy (Signed)
 OUTPATIENT PHYSICAL THERAPY VESTIBULAR EVALUATION     Patient Name: Tara Nunez MRN: [id number] DOB:09-Dec-1955, 68 y.o., female Today's Date: 07/03/2023  END OF SESSION:  PT End of Session - 07/03/23 1105     Visit Number 1    Number of Visits 2    Date for PT Re-Evaluation 07/24/23    Authorization Type [employer]    PT Start Time 1104    PT Stop Time 1150    PT Time Calculation (min) 46 min    Equipment Utilized During Treatment Gait belt    Activity Tolerance Patient tolerated treatment well    Behavior During Therapy WFL for tasks assessed/performed             Past Medical History:  Diagnosis Date   Allergy    Cataract    Irritant contact dermatitis 11/13/2021   Past Surgical History:  Procedure Laterality Date   CATARACT EXTRACTION, BILATERAL     with implants   CHOLECYSTECTOMY     COLONOSCOPY     last one in 2011   Patient Active Problem List   Diagnosis Date Noted   Dizziness 06/26/2023   Hx of abnormal cervical Pap smear 12/18/2022   Hx of abnormal mammogram 12/18/2022   Allergies 12/18/2022   Diabetes mellitus screening 12/18/2022   Encounter for lipid screening for cardiovascular disease 12/18/2022   Encounter for screening for HIV 12/18/2022   Encounter for hepatitis C screening test for low risk patient 12/18/2022   Need for vaccination against Streptococcus pneumoniae 12/18/2022   Osteopenia after menopause 09/22/2020   Overweight (BMI 25.0-29.9) 09/22/2020   Hearing loss 09/22/2020   Insomnia 09/22/2020   History of colonic polyps 09/22/2020    PCP: Dickie La, MD REFERRING PROVIDER: Dickie La, MD  REFERRING DIAG: R42 (ICD-10-CM) - Dizziness  THERAPY DIAG:  Dizziness and giddiness - Plan: PT plan of care cert/re-cert  ONSET DATE: 07/01/2023 (referral date)  Rationale for Evaluation and Treatment: Rehabilitation  SUBJECTIVE:   SUBJECTIVE STATEMENT: Patient reports long history of lightheadedness for years; however, when  she got back from [place], she started getting Product/process development scientist. When she opened the last window she got so dizzy she fell in the wall. Patient reports that the next day she saw things moving when she sat on toilet. Patient reports that the next day she was laying in bed and she got true room spinning. She reports that she gets this spinning when she turns over and bed and when she bends over. She did get nauseous the first time this happened. She had never had this happen before. She has an order for meclizine but has not started it yet. She reports it now only happens when she turns it head or bends down; has not noticed directional preference. Patient got compression socks to help with BP drops with positional changes but is not wearing them today.   Pt accompanied by: self  PERTINENT HISTORY: bilateral hearing loss, orthostatic hypotension  PAIN:  Are you having pain? No  PRECAUTIONS: Fall  RED FLAGS: None   WEIGHT BEARING RESTRICTIONS: No  FALLS: Has patient fallen in last 6 months? No - did bump into wall when got dizzy  LIVING ENVIRONMENT: Lives with: lives alone Lives in: House/apartment Stairs: No Has following equipment at home: Grab bars  PLOF: Independent - retired worked as a Runner, broadcasting/film/video  PATIENT GOALS: "Not to dizzy anymore ever."   OBJECTIVE:  Note: Objective measures were completed at Evaluation unless otherwise noted.  DIAGNOSTIC  FINDINGS: No relevant recent imaging   COGNITION: Overall cognitive status: Within functional limits for tasks assessed   SENSATION: WFL  EDEMA:  None   Cervical ROM:    Active A/PROM (deg) eval  Flexion WFL  Extension WFL  Right lateral flexion WFL  Left lateral flexion Mild restriction but [facility] for testing  Right rotation WFL  Left rotation WFL  (Blank rows = not tested)  GAIT:  Ambulates without AD (modI)  VESTIBULAR ASSESSMENT:  GENERAL OBSERVATION: had vision contraction with cataract surgery    SYMPTOM  BEHAVIOR:  Subjective history: symptoms started about 2-3 week ago  Non-Vestibular symptoms: nausea/vomiting Type of dizziness: Imbalance (Disequilibrium), Spinning/Vertigo, Unsteady with head/body turns, Lightheadedness/Faint, "Funny feeling in the head", and "World moves"  Frequency: a few times a week  Duration: seconds except for first time seemed like minutes  Aggravating factors: bending over or turning head in bed  Relieving factors: "talking to God"  Progression of symptoms: unchanged  OCULOMOTOR EXAM:  Ocular Alignment: normal  Ocular ROM: No Limitations  Spontaneous Nystagmus: absent  Gaze-Induced Nystagmus: age appropriate nystagmus at end range  Smooth Pursuits: intact  Saccades: intact  Convergence/Divergence: < 5 cm   Test Skew: WFL  VBI: negative bilaterallly   VESTIBULAR - OCULAR REFLEX:   Slow VOR: Normal  VOR Cancellation: Normal  Head-Impulse Test: HIT Right: negative HIT Left: negative  Dynamic Visual Acuity: Not performed   POSITIONAL TESTING:  Right Dix-Hallpike: no nystagmus Left Dix-Hallpike: no nystagmus Right Roll Test: no nystagmus Left Roll Test: no nystagmus Right Sidelying: no nystagmus Left Sidelying: no nystagmus  No nystagmus noted but patient consistently reports dizziness in L [employer] and L Sidelying, denies room spinning  MOTION SENSITIVITY:  Motion Sensitivity Quotient Intensity: 0 = none, 1 = Lightheaded, 2 = Mild, 3 = Moderate, 4 = Severe, 5 = Vomiting  Intensity  1. Sitting to supine 0  2. Supine to L side   3. Supine to R side   4. Supine to sitting 1  5. L Hallpike-Dix 1.5  6. Up from L  1  7. R Hallpike-Dix 0  8. Up from R  1  9. Sitting, head tipped to L knee 0  10. Head up from L knee 0  11. Sitting, head tipped to R knee 0  12. Head up from R knee 0  13. Sitting head turns x5 0  14.Sitting head nods x5 1  15. In stance, 180 turn to L  0  16. In stance, 180 turn to R 0    OTHOSTATICS:      07/03/2023    11:43 AM 07/03/2023   11:23 AM 07/03/2023   11:20 AM  Vitals with BMI  Systolic 106 119 536  Diastolic 68 84 90  Pulse 92 97 96       After positional testing Standing  Seated   Recommend more formal orthostatic testing next session, appears to be possible large contributor for dizziness  TREATMENT:   Self Care: Educated on etiology of different types of dizziness, discussed management of possible orthostatic hypotension and recommend follow up with PCP, will assess more formally in future sessions  PATIENT EDUCATION: Education details: POC, examination findings, goal collaboration Person educated: Patient Education method: Explanation Education comprehension: verbalized understanding and needs further education  HOME EXERCISE PROGRAM:  To be provided as indicated   GOALS: Goals reviewed with patient? Yes  LONG TERM GOALS: Target date: 07/24/2023  Patient will verbalize understanding of management of BP fluctuations with positional changes to help manage dizziness. Baseline: Educated on 3/12 Goal status: INITIAL  2.  Patient will reports no dizziness when rolling to the L and to the R in session. Baseline: reports some mild dizziness at baseline  Goal status: INITIAL  ASSESSMENT:  CLINICAL IMPRESSION: Patient is a 67 y.o. female who was seen today for physical therapy evaluation and treatment for dizziness. Patient describes what may have been an episode of BPPV; however, vestibular exam largely unremarkable today. Notably patient still reports minor symptoms when going to the L so recommend one additional visit to ensure no major residual motion sensitivity. Patient also presenting with BP drop in session following positional testing which appears consistent with PCP assessment for possible orthostatic hypotension. Recommend more formal assessment in future  sessions with education for management. Anticiapte needing only 1 more PT visits to finalize education and anticipate D/C at this time.   OBJECTIVE IMPAIRMENTS: dizziness.   ACTIVITY LIMITATIONS: bed mobility  PARTICIPATION LIMITATIONS: laundry and yard work  PERSONAL FACTORS: Age, Sex, and Time since onset of injury/illness/exacerbation are also affecting patient's functional outcome.   REHAB POTENTIAL: Good  CLINICAL DECISION MAKING: Stable/uncomplicated  EVALUATION COMPLEXITY: Low   PLAN:  PT FREQUENCY: 1x week  PT DURATION: 1 week  PLANNED INTERVENTIONS: 97164- PT Re-evaluation, 97110-Therapeutic exercises, 97530- Therapeutic activity, [id number]- Neuromuscular re-education, 97535- Self Care, 14782- Manual therapy, [id number]- Gait training, [phone]- Orthotic Fit/training, and [phone]- Canalith repositioning  PLAN FOR NEXT SESSION: assess motion sesitivity to L and with rolling and orthostatic hypotension, anticipate DC   Carmelia Bake, PT 07/03/2023, 12:45 PM

## 2023-07-12 ENCOUNTER — Ambulatory Visit: Payer: Self-pay | Admitting: Physical Therapy

## 2023-08-21 DIAGNOSIS — J3081 Allergic rhinitis due to animal (cat) (dog) hair and dander: Secondary | ICD-10-CM | POA: Diagnosis not present

## 2023-08-21 DIAGNOSIS — H1013 Acute atopic conjunctivitis, bilateral: Secondary | ICD-10-CM | POA: Diagnosis not present

## 2023-08-21 DIAGNOSIS — J301 Allergic rhinitis due to pollen: Secondary | ICD-10-CM | POA: Diagnosis not present

## 2023-08-21 DIAGNOSIS — L209 Atopic dermatitis, unspecified: Secondary | ICD-10-CM | POA: Diagnosis not present

## 2023-08-21 DIAGNOSIS — L509 Urticaria, unspecified: Secondary | ICD-10-CM | POA: Diagnosis not present

## 2023-09-03 ENCOUNTER — Ambulatory Visit (INDEPENDENT_AMBULATORY_CARE_PROVIDER_SITE_OTHER): Admitting: Internal Medicine

## 2023-09-03 ENCOUNTER — Encounter: Payer: Self-pay | Admitting: Internal Medicine

## 2023-09-03 VITALS — BP 126/64 | HR 97 | Temp 98.4°F | Ht 62.0 in | Wt 162.0 lb

## 2023-09-03 DIAGNOSIS — Z13220 Encounter for screening for lipoid disorders: Secondary | ICD-10-CM | POA: Diagnosis not present

## 2023-09-03 DIAGNOSIS — M8580 Other specified disorders of bone density and structure, unspecified site: Secondary | ICD-10-CM

## 2023-09-03 DIAGNOSIS — Z136 Encounter for screening for cardiovascular disorders: Secondary | ICD-10-CM | POA: Diagnosis not present

## 2023-09-03 DIAGNOSIS — G4700 Insomnia, unspecified: Secondary | ICD-10-CM | POA: Diagnosis not present

## 2023-09-03 DIAGNOSIS — R42 Dizziness and giddiness: Secondary | ICD-10-CM | POA: Diagnosis not present

## 2023-09-03 DIAGNOSIS — Z131 Encounter for screening for diabetes mellitus: Secondary | ICD-10-CM | POA: Diagnosis not present

## 2023-09-03 DIAGNOSIS — Z780 Asymptomatic menopausal state: Secondary | ICD-10-CM | POA: Diagnosis not present

## 2023-09-03 DIAGNOSIS — Z8742 Personal history of other diseases of the female genital tract: Secondary | ICD-10-CM | POA: Diagnosis not present

## 2023-09-03 LAB — POCT GLYCOSYLATED HEMOGLOBIN (HGB A1C): Hemoglobin A1C: 5.4 % (ref 4.0–5.6)

## 2023-09-03 LAB — GLUCOSE, CAPILLARY: Glucose-Capillary: 86 mg/dL (ref 70–99)

## 2023-09-03 NOTE — Assessment & Plan Note (Signed)
 Screening for hyperlipidemia given overweight with lipid panel today. Borderline ASCVD risk at last check. Continue healthy activity and nutrition.

## 2023-09-03 NOTE — Assessment & Plan Note (Signed)
 Referral to vestibular rehab made in March for concern for BPPV. Tara Nunez attended and underwent therapy maneuvers with resolution of her dizziness. She continues to wear compression stockings as well given some concern raised over orthostatic BP. Orthostatic vital signs negative today. Encouraged her to contact myself or vestibular rehab if symptoms return. Continue compression as tolerated.

## 2023-09-03 NOTE — Assessment & Plan Note (Signed)
 Continued issues with maintenance insomnia. Sleep onset has improved. No clear cause of frequent night time awakening. She does note snoring sometimes wakes her up. No polyuria. Sometimes able to fall back to sleep quickly. Does not feel refreshed upon awakening and tried during the day.   Tried lemborexant  following last visit without good effect. Not interested in medications that will make her groggy or act strangely. Discussed CBT-I at last visit but was not able to get established. She remains interested and referral to [facility] has been authorized. Encouraged her to call to set up an appointment.

## 2023-09-03 NOTE — Assessment & Plan Note (Signed)
 Repeat DEXA for monitoring of osteopenia. Tara Nunez continues to take calcium and vitamin D supplementation. Discussed regular exercise.

## 2023-09-03 NOTE — Patient Instructions (Signed)
 It was wonderful to see you today!  Please call [facility] to schedule an appointment to discuss insomnia.  I will call you with lab results when available.   Referral placed for DEXA (bone density) scan. You should receive a call to schedule. If you do not hear from them within two weeks, please call the [facility] to schedule.  *For exercise classes (in-person and online), group activities, and more* (50+)            [email or web address]  Please contact your pharmacy about scheduling the shingles (Shingrix) vaccine.

## 2023-09-03 NOTE — Assessment & Plan Note (Signed)
 Screening for diabetes with A1c today given overweight. No personal history of diabetes, family history positive. A1c within normal range. Recommend continued healthy activity and nutrition.

## 2023-09-03 NOTE — Assessment & Plan Note (Signed)
 History of remote abnormal Pap with subsequent normal screening. Screenings in 2021 and 2024 were normal. Given completion of recommended screening we discussed today that no further screening is necessary. Tara Nunez would feel most comfortable with continued screening, repeat in 5 years.

## 2023-09-03 NOTE — Progress Notes (Signed)
 Established Patient Office Visit  Subjective   Patient ID: Tara Nunez, female    DOB: 27-Aug-1955  Age: 68 y.o. MRN: [id number]  Chief Complaint  Patient presents with   Follow-up    Tara Nunez returns to clinic today for follow-up. Please see assessment/plan in problem-based charting for further details of today's visit.    Patient Active Problem List   Diagnosis Date Noted   Dizziness 06/26/2023   Hx of abnormal cervical Pap smear 12/18/2022   Hx of abnormal mammogram 12/18/2022   Allergies 12/18/2022   Diabetes mellitus screening 12/18/2022   Encounter for lipid screening for cardiovascular disease 12/18/2022   Osteopenia after menopause 09/22/2020   Overweight (BMI 25.0-29.9) 09/22/2020   Hearing loss 09/22/2020   Insomnia 09/22/2020   History of colonic polyps 09/22/2020       Objective:      BP 126/64 (BP Location: Right Arm, Patient Position: Standing, Cuff Size: Normal)   Pulse 97   Temp 98.4 F (36.9 C) (Oral)   Ht 5\' 2"  (1.575 m)   Wt 162 lb (73.5 kg)   SpO2 98% Comment: RA  BMI 29.63 kg/m  BP Readings from Last 3 Encounters:  09/03/23 126/64  07/03/23 106/68  12/18/22 129/72   Wt Readings from Last 3 Encounters:  09/03/23 162 lb (73.5 kg)  06/26/23 161 lb 1.6 oz (73.1 kg)  12/18/22 161 lb 4.8 oz (73.2 kg)      Physical Exam Constitutional:      General: She is not in acute distress.    Appearance: Normal appearance. She is not ill-appearing.  Pulmonary:     Effort: Pulmonary effort is normal.  Neurological:     General: No focal deficit present.     Mental Status: She is alert.     Gait: Gait normal.  Psychiatric:        Mood and Affect: Mood normal.        Behavior: Behavior normal.        Assessment & Plan:   Problem List Items Addressed This Visit       Musculoskeletal and Integument   Osteopenia after menopause - Primary   Repeat DEXA for monitoring of osteopenia. Tara Nunez continues to take calcium and vitamin D  supplementation. Discussed regular exercise.      Relevant Orders   DG Bone Density     Other   Insomnia   Continued issues with maintenance insomnia. Sleep onset has improved. No clear cause of frequent night time awakening. She does note snoring sometimes wakes her up. No polyuria. Sometimes able to fall back to sleep quickly. Does not feel refreshed upon awakening and tried during the day.   Tried lemborexant  following last visit without good effect. Not interested in medications that will make her groggy or act strangely. Discussed CBT-I at last visit but was not able to get established. She remains interested and referral to [facility] has been authorized. Encouraged her to call to set up an appointment.       Hx of abnormal cervical Pap smear   History of remote abnormal Pap with subsequent normal screening. Screenings in 2021 and 2024 were normal. Given completion of recommended screening we discussed today that no further screening is necessary. Tara Nunez would feel most comfortable with continued screening, repeat in 5 years.      Diabetes mellitus screening   Screening for diabetes with A1c today given overweight. No personal history of diabetes, family history positive. A1c within  normal range. Recommend continued healthy activity and nutrition.      Relevant Orders   POCT glycosylated hemoglobin (Hb A1C) (Completed)   Glucose, capillary (Completed)   Encounter for lipid screening for cardiovascular disease   Screening for hyperlipidemia given overweight with lipid panel today. Borderline ASCVD risk at last check. Continue healthy activity and nutrition.      Relevant Orders   Lipid panel   Dizziness   Referral to vestibular rehab made in March for concern for BPPV. Tara Nunez attended and underwent therapy maneuvers with resolution of her dizziness. She continues to wear compression stockings as well given some concern raised over orthostatic BP. Orthostatic  vital signs negative today. Encouraged her to contact myself or vestibular rehab if symptoms return. Continue compression as tolerated.       No follow-ups on file.      Bevelyn Bryant, MD

## 2023-09-04 ENCOUNTER — Ambulatory Visit: Payer: Self-pay | Admitting: Internal Medicine

## 2023-09-04 LAB — LIPID PANEL
Chol/HDL Ratio: 4.9 ratio — ABNORMAL HIGH (ref 0.0–4.4)
Cholesterol, Total: 215 mg/dL — ABNORMAL HIGH (ref 100–199)
HDL: 44 mg/dL (ref >39)
LDL Chol Calc (NIH): 150 mg/dL — ABNORMAL HIGH (ref 0–99)
Triglycerides: 119 mg/dL (ref 0–149)
VLDL Cholesterol Cal: 21 mg/dL (ref 5–40)

## 2023-09-05 ENCOUNTER — Encounter: Payer: Self-pay | Admitting: Internal Medicine

## 2023-09-05 NOTE — Progress Notes (Signed)
 The 10-year ASCVD risk score (Arnett DK, et al., 2019) is: 9.2%. Intermediate risk. Lifestyle changes vs. moderate-intensity statin. Will send MyChart message to discuss.

## 2023-09-17 ENCOUNTER — Encounter: Admitting: Internal Medicine

## 2023-09-17 DIAGNOSIS — J301 Allergic rhinitis due to pollen: Secondary | ICD-10-CM | POA: Diagnosis not present

## 2023-09-18 DIAGNOSIS — R0683 Snoring: Secondary | ICD-10-CM | POA: Diagnosis not present

## 2023-09-18 DIAGNOSIS — J3081 Allergic rhinitis due to animal (cat) (dog) hair and dander: Secondary | ICD-10-CM | POA: Diagnosis not present

## 2023-09-18 DIAGNOSIS — F5104 Psychophysiologic insomnia: Secondary | ICD-10-CM | POA: Diagnosis not present

## 2023-09-19 DIAGNOSIS — J3089 Other allergic rhinitis: Secondary | ICD-10-CM | POA: Diagnosis not present

## 2023-10-03 DIAGNOSIS — J3081 Allergic rhinitis due to animal (cat) (dog) hair and dander: Secondary | ICD-10-CM | POA: Diagnosis not present

## 2023-10-03 DIAGNOSIS — J301 Allergic rhinitis due to pollen: Secondary | ICD-10-CM | POA: Diagnosis not present

## 2023-10-10 DIAGNOSIS — J3081 Allergic rhinitis due to animal (cat) (dog) hair and dander: Secondary | ICD-10-CM | POA: Diagnosis not present

## 2023-10-10 DIAGNOSIS — J301 Allergic rhinitis due to pollen: Secondary | ICD-10-CM | POA: Diagnosis not present

## 2023-10-17 DIAGNOSIS — J301 Allergic rhinitis due to pollen: Secondary | ICD-10-CM | POA: Diagnosis not present

## 2023-10-17 DIAGNOSIS — J3081 Allergic rhinitis due to animal (cat) (dog) hair and dander: Secondary | ICD-10-CM | POA: Diagnosis not present

## 2023-10-22 DIAGNOSIS — J3081 Allergic rhinitis due to animal (cat) (dog) hair and dander: Secondary | ICD-10-CM | POA: Diagnosis not present

## 2023-10-22 DIAGNOSIS — J301 Allergic rhinitis due to pollen: Secondary | ICD-10-CM | POA: Diagnosis not present

## 2023-10-23 DIAGNOSIS — F5104 Psychophysiologic insomnia: Secondary | ICD-10-CM | POA: Diagnosis not present

## 2023-10-28 DIAGNOSIS — J3081 Allergic rhinitis due to animal (cat) (dog) hair and dander: Secondary | ICD-10-CM | POA: Diagnosis not present

## 2023-10-28 DIAGNOSIS — J301 Allergic rhinitis due to pollen: Secondary | ICD-10-CM | POA: Diagnosis not present

## 2023-11-01 DIAGNOSIS — H01003 Unspecified blepharitis right eye, unspecified eyelid: Secondary | ICD-10-CM | POA: Diagnosis not present

## 2023-11-11 DIAGNOSIS — J301 Allergic rhinitis due to pollen: Secondary | ICD-10-CM | POA: Diagnosis not present

## 2023-12-09 DIAGNOSIS — J3081 Allergic rhinitis due to animal (cat) (dog) hair and dander: Secondary | ICD-10-CM | POA: Diagnosis not present

## 2023-12-09 DIAGNOSIS — J301 Allergic rhinitis due to pollen: Secondary | ICD-10-CM | POA: Diagnosis not present

## 2023-12-16 DIAGNOSIS — J3081 Allergic rhinitis due to animal (cat) (dog) hair and dander: Secondary | ICD-10-CM | POA: Diagnosis not present

## 2023-12-16 DIAGNOSIS — J301 Allergic rhinitis due to pollen: Secondary | ICD-10-CM | POA: Diagnosis not present

## 2023-12-24 DIAGNOSIS — J3081 Allergic rhinitis due to animal (cat) (dog) hair and dander: Secondary | ICD-10-CM | POA: Diagnosis not present

## 2023-12-24 DIAGNOSIS — J301 Allergic rhinitis due to pollen: Secondary | ICD-10-CM | POA: Diagnosis not present

## 2023-12-25 DIAGNOSIS — L299 Pruritus, unspecified: Secondary | ICD-10-CM | POA: Diagnosis not present

## 2023-12-25 DIAGNOSIS — J302 Other seasonal allergic rhinitis: Secondary | ICD-10-CM | POA: Diagnosis not present

## 2023-12-30 DIAGNOSIS — J301 Allergic rhinitis due to pollen: Secondary | ICD-10-CM | POA: Diagnosis not present

## 2023-12-30 DIAGNOSIS — J3081 Allergic rhinitis due to animal (cat) (dog) hair and dander: Secondary | ICD-10-CM | POA: Diagnosis not present

## 2024-01-06 DIAGNOSIS — J301 Allergic rhinitis due to pollen: Secondary | ICD-10-CM | POA: Diagnosis not present

## 2024-01-06 DIAGNOSIS — J3081 Allergic rhinitis due to animal (cat) (dog) hair and dander: Secondary | ICD-10-CM | POA: Diagnosis not present

## 2024-01-13 DIAGNOSIS — J301 Allergic rhinitis due to pollen: Secondary | ICD-10-CM | POA: Diagnosis not present

## 2024-01-13 DIAGNOSIS — J3081 Allergic rhinitis due to animal (cat) (dog) hair and dander: Secondary | ICD-10-CM | POA: Diagnosis not present

## 2024-01-20 DIAGNOSIS — J3081 Allergic rhinitis due to animal (cat) (dog) hair and dander: Secondary | ICD-10-CM | POA: Diagnosis not present

## 2024-01-20 DIAGNOSIS — J301 Allergic rhinitis due to pollen: Secondary | ICD-10-CM | POA: Diagnosis not present

## 2024-01-27 DIAGNOSIS — J3081 Allergic rhinitis due to animal (cat) (dog) hair and dander: Secondary | ICD-10-CM | POA: Diagnosis not present

## 2024-01-27 DIAGNOSIS — J301 Allergic rhinitis due to pollen: Secondary | ICD-10-CM | POA: Diagnosis not present

## 2024-02-03 ENCOUNTER — Ambulatory Visit: Payer: Self-pay

## 2024-02-03 ENCOUNTER — Ambulatory Visit: Admitting: Student

## 2024-02-03 VITALS — BP 120/76 | HR 93 | Temp 98.2°F | Ht 62.0 in | Wt 150.4 lb

## 2024-02-03 DIAGNOSIS — M6281 Muscle weakness (generalized): Secondary | ICD-10-CM

## 2024-02-03 DIAGNOSIS — J3081 Allergic rhinitis due to animal (cat) (dog) hair and dander: Secondary | ICD-10-CM | POA: Diagnosis not present

## 2024-02-03 DIAGNOSIS — J301 Allergic rhinitis due to pollen: Secondary | ICD-10-CM | POA: Diagnosis not present

## 2024-02-03 NOTE — Telephone Encounter (Addendum)
 FYI Only or Action Required?: FYI only for provider.  Patient was last seen in primary care on 09/03/2023 by Karna Fellows, MD.  Called Nurse Triage reporting Extremity Weakness.  Symptoms began about a month ago.  Interventions attempted: Rest, hydration, or home remedies.  Symptoms are: unchanged.  Triage Disposition: See Physician Within 24 Hours  Patient/caregiver understands and will follow disposition?: Yes  **Appt. Scheduled for 10/13**    Copied from CRM #[id number]. Topic: Clinical - Red Word Triage >> Feb 03, 2024  2:24 PM Antonio DEL wrote: Red Word that prompted transfer to Nurse Triage: limbs have been so weak, fell a couple of weeks ago after pushing raft. feels trembling in her legs. Feels really really weak. Has to crawl to get off the floor. Reason for Disposition  [1] MODERATE weakness (e.g., interferes with work, school, normal activities) AND [2] persists > 3 days  Answer Assessment - Initial Assessment Questions 1. DESCRIPTION: Describe how you are feeling.     BIL Legs, BIL arms   2. SEVERITY: How bad is it?  Can you stand and walk?     7/10, yes patient can ambulate   3. ONSET: When did these symptoms begin? (e.g., hours, days, weeks, months)     X 1 month   4. CAUSE: What do you think is causing the weakness or fatigue? (e.g., not drinking enough fluids, medical problem, trouble sleeping)       Moving a raft a month ago; she is unsure  exactly   5. NEW MEDICINES:  Have you started on any new medicines recently? (e.g., opioid pain medicines, benzodiazepines, muscle relaxants, antidepressants, antihistamines, neuroleptics, beta blockers)     No   6. OTHER SYMPTOMS: Do you have any other symptoms? (e.g., chest pain, fever, cough, SOB, vomiting, diarrhea, bleeding, other areas of pain)  No    Went rafting last month with Family, she was pushing a rafter at that time, she now complains of weakness in limbs, no numbness, no other acute symptoms  noted. Appt. Scheduled for 10/13 to follow up  Protocols used: Weakness (Generalized) and Fatigue-A-AH

## 2024-02-03 NOTE — Telephone Encounter (Signed)
 Pt has a scheduled appt today 10/13 with Dr Jolaine.

## 2024-02-03 NOTE — Progress Notes (Unsigned)
 CC: Acute Concern of chronic proximal muscle weakness  HPI:  Tara Nunez is a 68 y.o. female with pertinent PMH of insomnia and osteopenia who presents as above. Please see assessment and plan below for further details.  Medications: Current Outpatient Medications  Medication Instructions   APPLE CIDER VINEGAR PO 1 tablet, Daily   cetirizine (ZYRTEC) 10 mg, Oral, Daily   Ergocalciferol (VITAMIN D2 PO) 2 tablets, Daily   fluticasone (FLONASE) 50 MCG/ACT nasal spray 1 spray, Each Nare, Daily   folic acid (FOLVITE) 400 mcg, Daily   montelukast (SINGULAIR) 10 mg, Oral, Daily at bedtime   Multiple Minerals-Vitamins (CALCIUM & VIT D3 BONE HEALTH PO) 1 tablet, Daily   triamcinolone  (KENALOG ) 0.025 % ointment 1 Application, Topical, 2 times daily     Review of Systems:   Pertinent items noted in HPI and/or A&P.  Physical Exam:  Vitals:   02/03/24 1610  BP: 120/76  Pulse: 93  Temp: 98.2 F (36.8 C)  TempSrc: Oral  SpO2: 95%  Weight: 150 lb 6.4 oz (68.2 kg)  Height: 5' 2 (1.575 m)    Constitutional: Well-appearing adult female. In no acute distress. HEENT: Normocephalic, atraumatic, Sclera non-icteric, PERRL, EOM intact Cardio:Regular rate and rhythm. 2+ bilateral radial and dorsalis pedis  pulses. Pulm:Clear to auscultation bilaterally. Normal work of breathing on room air. FDX:Wzhjupcz for extremity edema.  No muscle atrophy in bilateral upper or lower extremities. Skin:Warm and dry. Neuro:Alert and oriented x3. -4/5 strength in bilateral hip flexors.  5/5 strength in remaining bilateral lower extremit and intact sensation.  Normal get up and go and normal gait. Psych:Pleasant mood and affect.   Assessment & Plan:   Assessment & Plan Proximal muscle weakness Patient presents today with concerns of chronic proximal muscle weakness most noticeably in her lower extremities.  She has always had trouble getting up from the floor and frequently has had to get help.   Recently she was tubing on a river with her sister when she had to get out and push the tube due to it getting stuck on some rocks and fell down multiple times.  She was unable to get up without moving closer to a larger rock to pull herself up.  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  She denies any numbness, paresthesias, pain, skin changes, or other associated symptoms.  No noted fatigability, improvement in strength of definitive actions, worsening or improving symptoms with time of day.  She denies any worsening of her symptoms but she is worried that she has not gotten this worked up before.  On exam get up and go was within normal limits, gait normal, gait normal, strength testing lower extremity showed -4/5 strength in bilateral hip flexors but otherwise 5/5 strength in the lower extremities and no sensory abnormalities.  Differential includes many things including autoimmune disorders, central and peripheral nerve issues, or possibly simple chronic hip flexor weakness. - Extensive lab workup showed normal results for CBC with differential, CMP, CK, magnesium, phosphorus, TSH, CRP, ANA, acetylcholinesterase receptor antibody, and pending musk antibodies.  ESR was slightly elevated at 60. Plan - Referral to neurology for possible nerve conduction testing - Referral to physical therapy to work on hip flexor strength - Return precautions discussed - Return in 3 months for reevaluation  Orders Placed This Encounter  Procedures   AChR Abs  with Reflex to MuSK   ANA W/Rfx to all if Positive   CBC with Diff   CK, total   Comprehensive metabolic panel with GFR   CRP (C-Reactive Protein)   Magnesium   Phosphorus   Sed Rate (ESR)   TSH   MuSK Antibodies   Ambulatory referral to Physical Therapy    Referral Priority:   Routine    Referral Type:   Physical Medicine     Referral Reason:   Specialty Services Required    Requested Specialty:   Physical Therapy    Number of Visits Requested:   1   Ambulatory referral to Neurology    Referral Priority:   Routine    Referral Type:   Consultation    Referral Reason:   Specialty Services Required    Requested Specialty:   Neurology    Number of Visits Requested:   1     Return in about 3 months (around 05/05/2024) for Follow up muscle weakness.   Patient discussed with Dr. MICAEL Riis Winfrey  Fairy Pool, DO [facility] Internal Medicine Resident PGY-3 Clinic Phone: [phone] Please contact the on call pager at [phone] for any urgent or emergent needs.

## 2024-02-03 NOTE — Patient Instructions (Addendum)
 Thank you, [name], for allowing us  to provide your care today. Today we discussed . . .  > Muscle weakness       - I am not sure what is causing her muscle weakness but I have a few labs I want to get and some other tests.  I would like to send you to physical therapy to start some specific muscle exercises to see if this is primarily due to just muscle deconditioning.  We will have come back to get blood drawn to check for any electrolyte abnormalities or other signs of muscle problems.  I would also like to send you to neurology to get nerve conduction studies to see if this has anything to do with your nerves not stimulating your muscles like they should.  Please let us  know if you have any new or worsening symptoms before you follow-up.  I will call you with any abnormal lab results.  You should be able to have several physical therapy appointments and see neurology hopefully before you follow-up with us .   Follow up: 3 months    Remember:  Should you have any questions or concerns please call the internal medicine clinic at [phone].     Fairy Pool, DO [facility]

## 2024-02-04 ENCOUNTER — Encounter: Payer: Self-pay | Admitting: Internal Medicine

## 2024-02-04 ENCOUNTER — Other Ambulatory Visit

## 2024-02-06 NOTE — Progress Notes (Signed)
 Internal Medicine Clinic Attending  Case discussed with the resident at the time of the visit.  We reviewed the resident's history and exam and pertinent patient test results.  I agree with the assessment, diagnosis, and plan of care documented in the resident's note.

## 2024-02-10 DIAGNOSIS — J3081 Allergic rhinitis due to animal (cat) (dog) hair and dander: Secondary | ICD-10-CM | POA: Diagnosis not present

## 2024-02-10 DIAGNOSIS — J301 Allergic rhinitis due to pollen: Secondary | ICD-10-CM | POA: Diagnosis not present

## 2024-02-14 LAB — COMPREHENSIVE METABOLIC PANEL WITH GFR
ALT: 15 IU/L (ref 0–32)
AST: 18 IU/L (ref 0–40)
Albumin: 4.2 g/dL (ref 3.9–4.9)
Alkaline Phosphatase: 68 IU/L (ref 49–135)
BUN/Creatinine Ratio: 19 (ref 12–28)
BUN: 14 mg/dL (ref 8–27)
Bilirubin Total: 0.3 mg/dL (ref 0.0–1.2)
CO2: 22 mmol/L (ref 20–29)
Calcium: 9.2 mg/dL (ref 8.7–10.3)
Chloride: 104 mmol/L (ref 96–106)
Creatinine, Ser: 0.72 mg/dL (ref 0.57–1.00)
Globulin, Total: 2.8 g/dL (ref 1.5–4.5)
Glucose: 92 mg/dL (ref 70–99)
Potassium: 4 mmol/L (ref 3.5–5.2)
Sodium: 140 mmol/L (ref 134–144)
Total Protein: 7 g/dL (ref 6.0–8.5)
eGFR: 92 mL/min/1.73 (ref >59)

## 2024-02-14 LAB — CBC WITH DIFFERENTIAL/PLATELET
Basophils Absolute: 0 x10E3/uL (ref 0.0–0.2)
Basos: 0 %
EOS (ABSOLUTE): 0.1 x10E3/uL (ref 0.0–0.4)
Eos: 2 %
Hematocrit: 41.4 % (ref 34.0–46.6)
Hemoglobin: 13.3 g/dL (ref 11.1–15.9)
Immature Grans (Abs): 0 x10E3/uL (ref 0.0–0.1)
Immature Granulocytes: 0 %
Lymphocytes Absolute: 2.5 x10E3/uL (ref 0.7–3.1)
Lymphs: 34 %
MCH: 30.2 pg (ref 26.6–33.0)
MCHC: 32.1 g/dL (ref 31.5–35.7)
MCV: 94 fL (ref 79–97)
Monocytes Absolute: 0.6 x10E3/uL (ref 0.1–0.9)
Monocytes: 8 %
Neutrophils Absolute: 4 x10E3/uL (ref 1.4–7.0)
Neutrophils: 56 %
Platelets: 243 x10E3/uL (ref 150–450)
RBC: 4.4 x10E6/uL (ref 3.77–5.28)
RDW: 12.3 % (ref 11.7–15.4)
WBC: 7.2 x10E3/uL (ref 3.4–10.8)

## 2024-02-14 LAB — ACHR ABS WITH REFLEX TO MUSK: AChR Binding Ab, Serum: <0.07 nmol/L (ref 0.00–0.24)

## 2024-02-14 LAB — MUSK ANTIBODIES: MuSK Antibodies: <1 U/mL

## 2024-02-14 LAB — CK: Total CK: 37 U/L (ref 32–182)

## 2024-02-14 LAB — C-REACTIVE PROTEIN: CRP: 6 mg/L (ref 0–10)

## 2024-02-14 LAB — TSH: TSH: 1.23 u[IU]/mL (ref 0.450–4.500)

## 2024-02-14 LAB — SEDIMENTATION RATE: Sed Rate: 60 mm/h — ABNORMAL HIGH (ref 0–40)

## 2024-02-14 LAB — PHOSPHORUS: Phosphorus: 2.9 mg/dL — ABNORMAL LOW (ref 3.0–4.3)

## 2024-02-14 LAB — ANA W/RFX TO ALL IF POSITIVE: Anti Nuclear Antibody (ANA): NEGATIVE

## 2024-02-14 LAB — MAGNESIUM: Magnesium: 2.3 mg/dL (ref 1.6–2.3)

## 2024-02-26 ENCOUNTER — Ambulatory Visit

## 2024-03-31 ENCOUNTER — Ambulatory Visit: Attending: Internal Medicine

## 2024-07-07 ENCOUNTER — Ambulatory Visit: Admitting: Neurology
